# Patient Record
Sex: Female | Born: 1955
Health system: Southern US, Community
[De-identification: ages and names within clinical notes are randomized; demographics above are authoritative.]

## PROBLEM LIST (undated history)

## (undated) DIAGNOSIS — C801 Malignant (primary) neoplasm, unspecified: Secondary | ICD-10-CM

---

## 2007-10-13 ENCOUNTER — Ambulatory Visit: Payer: Self-pay | Admitting: Unknown Physician Specialty

## 2008-01-06 ENCOUNTER — Ambulatory Visit: Payer: Self-pay | Admitting: Gastroenterology

## 2008-01-06 LAB — HM COLONOSCOPY

## 2010-10-02 ENCOUNTER — Ambulatory Visit: Payer: Self-pay | Admitting: Family Medicine

## 2011-10-15 ENCOUNTER — Ambulatory Visit: Payer: Self-pay | Admitting: Unknown Physician Specialty

## 2012-12-01 ENCOUNTER — Ambulatory Visit: Payer: Self-pay | Admitting: Obstetrics and Gynecology

## 2013-12-23 ENCOUNTER — Ambulatory Visit: Payer: Self-pay | Admitting: Family Medicine

## 2014-11-06 DIAGNOSIS — C439 Malignant melanoma of skin, unspecified: Secondary | ICD-10-CM

## 2014-11-06 DIAGNOSIS — C4491 Basal cell carcinoma of skin, unspecified: Secondary | ICD-10-CM

## 2014-11-06 HISTORY — DX: Malignant melanoma of skin, unspecified: C43.9

## 2014-11-06 HISTORY — DX: Basal cell carcinoma of skin, unspecified: C44.91

## 2015-02-20 ENCOUNTER — Other Ambulatory Visit: Payer: Self-pay | Admitting: Family Medicine

## 2015-02-20 DIAGNOSIS — Z1231 Encounter for screening mammogram for malignant neoplasm of breast: Secondary | ICD-10-CM

## 2015-02-27 ENCOUNTER — Ambulatory Visit
Admission: RE | Admit: 2015-02-27 | Discharge: 2015-02-27 | Disposition: A | Discharge status: Home / Self Care | Payer: BLUE CROSS/BLUE SHIELD | Source: Ambulatory Visit | Attending: Family Medicine | Admitting: Family Medicine

## 2015-02-27 DIAGNOSIS — Z1231 Encounter for screening mammogram for malignant neoplasm of breast: Secondary | ICD-10-CM | POA: Insufficient documentation

## 2015-02-27 HISTORY — DX: Malignant (primary) neoplasm, unspecified: C80.1

## 2015-02-27 LAB — HM MAMMOGRAPHY

## 2015-07-31 ENCOUNTER — Ambulatory Visit (INDEPENDENT_AMBULATORY_CARE_PROVIDER_SITE_OTHER): Payer: BLUE CROSS/BLUE SHIELD | Admitting: Family Medicine

## 2015-07-31 ENCOUNTER — Encounter: Payer: Self-pay | Admitting: Family Medicine

## 2015-07-31 VITALS — BP 123/80 | HR 67 | Temp 98.7°F | Ht 64.0 in | Wt 169.0 lb

## 2015-07-31 DIAGNOSIS — E785 Hyperlipidemia, unspecified: Secondary | ICD-10-CM | POA: Insufficient documentation

## 2015-07-31 DIAGNOSIS — I10 Essential (primary) hypertension: Secondary | ICD-10-CM

## 2015-07-31 DIAGNOSIS — Z23 Encounter for immunization: Secondary | ICD-10-CM

## 2015-07-31 LAB — LP+ALT+AST PICCOLO, WAIVED
ALT (SGPT) Piccolo, Waived: 24 U/L (ref 10–47)
AST (SGOT) PICCOLO, WAIVED: 33 U/L (ref 11–38)
CHOL/HDL RATIO PICCOLO,WAIVE: 4.2 mg/dL
CHOLESTEROL PICCOLO, WAIVED: 218 mg/dL — AB (ref <200)
HDL Chol Piccolo, Waived: 52 mg/dL — ABNORMAL LOW (ref >59)
LDL Chol Calc Piccolo Waived: 118 mg/dL — ABNORMAL HIGH (ref <100)
Triglycerides Piccolo,Waived: 237 mg/dL — ABNORMAL HIGH (ref <150)
VLDL Chol Calc Piccolo,Waive: 47 mg/dL — ABNORMAL HIGH (ref <30)

## 2015-07-31 MED ORDER — LOVASTATIN 20 MG PO TABS
20.0000 mg | ORAL_TABLET | Freq: Every day | ORAL | Status: DC
Start: 2015-07-31 — End: 2016-01-31

## 2015-07-31 MED ORDER — LOSARTAN POTASSIUM 50 MG PO TABS: 50.0000 mg | ORAL_TABLET | Freq: Every day | ORAL | Status: DC

## 2015-07-31 NOTE — Assessment & Plan Note (Signed)
The current medical regimen is effective;  continue present plan and medications.  

## 2015-07-31 NOTE — Progress Notes (Signed)
BP 123/80 mmHg  Pulse 67  Temp(Src) 98.7 F (37.1 C)  Ht 5\' 4"  (1.626 m)  Wt 169 lb (76.658 kg)  BMI 28.99 kg/m2  SpO2 99%   Subjective:    Patient ID: Brenda Wagner, female    DOB: 06-08-1956, 59 y.o.   MRN: [id number]  HPI: Brenda Wagner is a 59 y.o. female  Chief Complaint  Patient presents with  . Hyperlipidemia  . Hypertension   patient follow-up cholesterol doing well has been walking and eating a little better diet in spite of 2 pound weight gain. No complaints from lovastatin 20 mg The pressure doing well on low-dose losartan no side effects takes medicines faithfully Ever since menopause of Imitrex as needed very little but doing well with migraines.  Relevant past medical, surgical, family and social history reviewed and updated as indicated. Interim medical history since our last visit reviewed. Allergies and medications reviewed and updated.  Review of Systems  Constitutional: Negative.   Respiratory: Negative.   Cardiovascular: Negative.     Per HPI unless specifically indicated above     Objective:    BP 123/80 mmHg  Pulse 67  Temp(Src) 98.7 F (37.1 C)  Ht 5\' 4"  (1.626 m)  Wt 169 lb (76.658 kg)  BMI 28.99 kg/m2  SpO2 99%  Wt Readings from Last 3 Encounters:  07/31/15 169 lb (76.658 kg)  07/30/15 167 lb (75.751 kg)    Physical Exam  Constitutional: She is oriented to person, place, and time. She appears well-developed and well-nourished. No distress.  HENT:  Head: Normocephalic and atraumatic.  Right Ear: Hearing normal.  Left Ear: Hearing normal.  Nose: Nose normal.  Eyes: Conjunctivae and lids are normal. Right eye exhibits no discharge. Left eye exhibits no discharge. No scleral icterus.  Cardiovascular: Normal rate, regular rhythm and normal heart sounds.   Pulmonary/Chest: Effort normal and breath sounds normal. No respiratory distress.  Musculoskeletal: Normal range of motion.  Neurological: She is alert and oriented to person,  place, and time.  Skin: Skin is intact. No rash noted.  Psychiatric: She has a normal mood and affect. Her speech is normal and behavior is normal. Judgment and thought content normal. Cognition and memory are normal.    Results for orders placed or performed in visit on 07/30/15  [facility] MAMMOGRAPHY  Result Value Ref Range   [facility] Mammogram PRACTICE PARTNER   [facility] COLONOSCOPY  Result Value Ref Range   [facility] Colonoscopy PRACTICE PARTNER       Assessment & Plan:   Problem List Items Addressed This Visit      Cardiovascular and Mediastinum   Essential hypertension, benign    The current medical regimen is effective;  continue present plan and medications.       Relevant Medications   losartan (COZAAR) 50 MG tablet   lovastatin (MEVACOR) 20 MG tablet   Other Relevant Orders   LP+ALT+AST Piccolo, Waived   Basic metabolic panel     Other   Hyperlipemia - Primary    The current medical regimen is effective;  continue present plan and medications.       Relevant Medications   losartan (COZAAR) 50 MG tablet   lovastatin (MEVACOR) 20 MG tablet   Other Relevant Orders   LP+ALT+AST Piccol[place], Waived   Basic metabolic panel    Other Visit Diagnoses    Immunization due        Relevant Orders    Flu Vaccine QUAD 36+ mos PF IM (  Fluarix & Fluzone Quad PF) (Completed)    Varicella-zoster vaccine subcutaneous (Completed)        Follow up plan: Return in about 6 months (around 01/29/2016) for Physical Exam.

## 2015-08-01 ENCOUNTER — Encounter: Payer: Self-pay | Admitting: Family Medicine

## 2015-08-01 LAB — BASIC METABOLIC PANEL
BUN / CREAT RATIO: 23 (ref 11–26)
BUN: 15 mg/dL (ref 8–27)
CHLORIDE: 100 mmol/L (ref 97–106)
CO2: 24 mmol/L (ref 18–29)
CREATININE: 0.64 mg/dL (ref 0.57–1.00)
Calcium: 9.5 mg/dL (ref 8.7–10.3)
GFR calc Af Amer: 112 mL/min/{1.73_m2} (ref >59)
GFR calc non Af Amer: 97 mL/min/{1.73_m2} (ref >59)
GLUCOSE: 105 mg/dL — AB (ref 65–99)
Potassium: 4.4 mmol/L (ref 3.5–5.2)
Sodium: 141 mmol/L (ref 136–144)

## 2015-08-13 ENCOUNTER — Ambulatory Visit (INDEPENDENT_AMBULATORY_CARE_PROVIDER_SITE_OTHER): Payer: BLUE CROSS/BLUE SHIELD | Admitting: Family Medicine

## 2015-08-13 ENCOUNTER — Encounter: Payer: Self-pay | Admitting: Family Medicine

## 2015-08-13 VITALS — BP 146/82 | HR 78 | Temp 98.8°F | Ht 63.2 in | Wt 170.0 lb

## 2015-08-13 DIAGNOSIS — H109 Unspecified conjunctivitis: Secondary | ICD-10-CM

## 2015-08-13 MED ORDER — ERYTHROMYCIN 5 MG/GM OP OINT: 1.0000 "application " | TOPICAL_OINTMENT | Freq: Every day | OPHTHALMIC | Status: DC

## 2015-08-13 NOTE — Patient Instructions (Signed)
Erythromycin eye ointment  What is this medicine?  ERYTHROMYCIN (er ith roe MYE sin) is a macrolide antibiotic. It is used to treat bacterial eye infections. It also prevents a certain type of eye infection that can occur in some babies.  This medicine may be used for other purposes; ask your health care provider or pharmacist if you have questions.  What should I tell my health care provider before I take this medicine?  -if you have an unusual or allergic reaction to erythromycin, foods, dyes, or preservatives  -pregnant or trying to get pregnant  -breast-feeding  How should I use this medicine?  This medicine is only for use in the eye. Follow the directions on the prescription label. Wash hands before and after use. Tilt your head back slightly and pull your lower eyelid down with your index finger to form a pouch. Try not to touch the tip of the tube, to your eye, fingertips, or any other surface. Squeeze the end of the tube to apply a thin layer of the ointment to the inside of the lower eyelid. Close the eye gently to spread the ointment. Your vision may blur for a few minutes. Use your doses at regular intervals. Do not use your medicine more often than directed. Finish the full course prescribed by your doctor or health care professional even if you think your condition is better. Do not stop using except on the advice of your doctor or health care professional.  Talk to your pediatrician regarding the use of this medicine in children. Special care may be needed.  Overdosage: If you think you have taken too much of this medicine contact a poison control center or emergency room at once.  NOTE: This medicine is only for you. Do not share this medicine with others.  What if I miss a dose?  If you miss a dose, use it as soon as you can. If it is almost time for your next dose, use only that dose. Do not use double or extra doses.  What may interact with this medicine?  Interactions are not expected. Do not use  any other eye products without telling your doctor or health care professional.  This list may not describe all possible interactions. Give your health care provider a list of all the medicines, herbs, non-prescription drugs, or dietary supplements you use. Also tell them if you smoke, drink alcohol, or use illegal drugs. Some items may interact with your medicine.  What should I watch for while using this medicine?  Tell your doctor or health care professional if your symptoms do not improve in 2 to 3 days.  What side effects may I notice from receiving this medicine?  Side effects that you should report to your doctor or health care professional as soon as possible:  -allergic reactions like skin rash, itching or hives, swelling of the face, lips, or tongue  -burning, stinging, or itching of the eyes or eyelids  -changes in vision  -redness, swelling, or pain  This list may not describe all possible side effects. Call your doctor for medical advice about side effects. You may report side effects to FDA at 1-800-FDA-1088.  Where should I keep my medicine?  Keep out of the reach of children.  Store at room temperature between 15 and 30 degrees C (59 and 86 degrees F). Do not freeze. Throw away any unused ointment after the expiration date.  NOTE: This sheet is a summary. It may not cover all 

## 2015-08-13 NOTE — Progress Notes (Signed)
BP 146/82 mmHg  Pulse 78  Temp(Src) 98.8 F (37.1 C)  Ht 5' 3.2" (1.605 m)  Wt 170 lb (77.111 kg)  BMI 29.93 kg/m2  SpO2 97%   Subjective:    Patient ID: Brenda Wagner, female    DOB: [birth year], 59 y.o.   MRN: [id number]  HPI: Brenda Wagner is a 59 y.o. female  Chief Complaint  Patient presents with  . Eye Pain    painful and painful   EYE PAIN- was on the fluoroplex started on Sunday night and started with eye irritation on Wednesday. Stopped medicine on Friday Duration:  5 days Involved eye:  left Onset: sudden Severity: fine during the day, but morning and at night has a mild pain  Quality: dull Foreign body sensation:yes Visual impairment: no Eye redness: yes Discharge: no Crusting or matting of eyelids: no Swelling: no Photophobia: no Itching: no Tearing: yes Headache: no Floaters: no URI symptoms: yes Contact lens use: no Close contacts with similar problems: no Eye trauma: no Status: stable Treatments attempted: nothing  Relevant past medical, surgical, family and social history reviewed and updated as indicated. Interim medical history since our last visit reviewed. Allergies and medications reviewed and updated.  Review of Systems  Constitutional: Negative.   HENT: Negative.   Eyes: Positive for pain and redness. Negative for photophobia, discharge, itching and visual disturbance.  Respiratory: Negative.   Cardiovascular: Negative.   Psychiatric/Behavioral: Negative.     Per HPI unless specifically indicated above     Objective:    BP 146/82 mmHg  Pulse 78  Temp(Src) 98.8 F (37.1 C)  Ht 5' 3.2" (1.605 m)  Wt 170 lb (77.111 kg)  BMI 29.93 kg/m2  SpO2 97%  Wt Readings from Last 3 Encounters:  08/13/15 170 lb (77.111 kg)  07/31/15 169 lb (76.658 kg)  07/30/15 167 lb (75.751 kg)    Physical Exam  Constitutional: Brenda Wagner is oriented to person, place, and time. Brenda Wagner appears well-developed and well-nourished. No distress.  HENT:  Head:  Normocephalic and atraumatic.  Right Ear: Hearing normal.  Left Ear: Hearing normal.  Nose: Nose normal.  Eyes: EOM and lids are normal. Pupils are equal, round, and reactive to light. Right eye exhibits no chemosis, no discharge, no exudate and no hordeolum. No foreign body present in the right eye. Left eye exhibits no chemosis, no discharge, no exudate and no hordeolum. No foreign body present in the left eye. Right conjunctiva is not injected. Right conjunctiva has no hemorrhage. Left conjunctiva is injected. Left conjunctiva has no hemorrhage. No scleral icterus. Right eye exhibits normal extraocular motion and no nystagmus. Left eye exhibits normal extraocular motion and no nystagmus. Right pupil is round and reactive. Left pupil is round and reactive. Pupils are equal.    Neck: Normal range of motion. Neck supple. No JVD present. No tracheal deviation present. No thyromegaly present.  Pulmonary/Chest: Effort normal. No stridor. No respiratory distress.  Musculoskeletal: Normal range of motion.  Lymphadenopathy:    Brenda Wagner has no cervical adenopathy.  Neurological: Brenda Wagner is alert and oriented to person, place, and time.  Skin: Skin is intact. No rash noted.  Psychiatric: Brenda Wagner has a normal mood and affect. Her speech is normal and behavior is normal. Judgment and thought content normal. Cognition and memory are normal.  Nursing note and vitals reviewed.   Results for orders placed or performed in visit on 07/31/15  LP+ALT+AST Piccolo, Waived  Result Value Ref Range   ALT (SGPT) [place], Waived  24 10 - 47 U/L   AST (SGOT) [place]col[place], Waived 33 11 - 38 U/L   Cholesterol Piccolo, Waived 218 (H) <200 mg/dL   HDL Chol Piccolo, Waived 52 (L) >59 mg/dL   Triglycerides Piccolo,Waived 237 (H) <150 mg/dL   Chol/HDL Ratio Piccolo,Waive 4.2 mg/dL   LDL Chol Calc Piccolo Waived 118 (H) <100 mg/dL   VLDL Chol Calc Piccolo,Waive 47 (H) <30 mg/dL  Basic metabolic panel  Result Value Ref Range   Glucose 105  (H) 65 - 99 mg/dL   BUN 15 8 - 27 mg/dL   Creatinine, Ser 0.64 0.57 - 1.00 mg/dL   GFR calc non Af Amer 97 >59 mL/min/1.73   GFR calc Af Amer 112 >59 mL/min/1.73   BUN/Creatinine Ratio 23 11 - 26   Sodium 141 136 - 144 mmol/L   Potassium 4.4 3.5 - 5.2 mmol/L   Chloride 100 97 - 106 mmol/L   CO2 24 18 - 29 mmol/L   Calcium 9.5 8.7 - 10.3 mg/dL      Assessment & Plan:   Problem List Items Addressed This Visit    None    Visit Diagnoses    Conjunctivitis of left eye    -  Primary    Likely due to her fluoroplex. Will start erythromycin ointment for irritation and anti-inflammatory. If not better in 2-3 days, Brenda Wagner was instructed to call derm.        Follow up plan: Return if symptoms worsen or fail to improve.

## 2015-10-04 DIAGNOSIS — D239 Other benign neoplasm of skin, unspecified: Secondary | ICD-10-CM

## 2015-10-04 HISTORY — DX: Other benign neoplasm of skin, unspecified: D23.9

## 2015-12-20 ENCOUNTER — Telehealth: Payer: Self-pay | Admitting: Family Medicine

## 2015-12-20 MED ORDER — OSELTAMIVIR PHOSPHATE 75 MG PO CAPS: 75.0000 mg | ORAL_CAPSULE | Freq: Every day | ORAL | Status: DC

## 2015-12-20 NOTE — Telephone Encounter (Signed)
Husband tested positive for influenza A. Prophylactic tamiflu given today.

## 2016-01-31 ENCOUNTER — Ambulatory Visit (INDEPENDENT_AMBULATORY_CARE_PROVIDER_SITE_OTHER): Payer: BLUE CROSS/BLUE SHIELD | Admitting: Family Medicine

## 2016-01-31 ENCOUNTER — Encounter: Payer: Self-pay | Admitting: Family Medicine

## 2016-01-31 VITALS — BP 131/83 | HR 65 | Temp 97.9°F | Ht 64.2 in | Wt 168.0 lb

## 2016-01-31 DIAGNOSIS — E785 Hyperlipidemia, unspecified: Secondary | ICD-10-CM

## 2016-01-31 DIAGNOSIS — Z1239 Encounter for other screening for malignant neoplasm of breast: Secondary | ICD-10-CM | POA: Diagnosis not present

## 2016-01-31 DIAGNOSIS — Z0000 Encounter for general adult medical examination without abnormal findings: Secondary | ICD-10-CM | POA: Diagnosis not present

## 2016-01-31 DIAGNOSIS — I10 Essential (primary) hypertension: Secondary | ICD-10-CM

## 2016-01-31 LAB — MICROSCOPIC EXAMINATION

## 2016-01-31 LAB — URINALYSIS, ROUTINE W REFLEX MICROSCOPIC
BILIRUBIN UA: NEGATIVE
GLUCOSE, UA: NEGATIVE
KETONES UA: NEGATIVE
Leukocytes, UA: NEGATIVE
NITRITE UA: NEGATIVE
SPEC GRAV UA: 1.015 (ref 1.005–1.030)
UUROB: 0.2 mg/dL (ref 0.2–1.0)
pH, UA: 7 (ref 5.0–7.5)

## 2016-01-31 MED ORDER — LOSARTAN POTASSIUM 50 MG PO TABS: 50.0000 mg | ORAL_TABLET | Freq: Every day | ORAL | Status: DC

## 2016-01-31 MED ORDER — LOVASTATIN 20 MG PO TABS: 20.0000 mg | ORAL_TABLET | Freq: Every day | ORAL | Status: DC

## 2016-01-31 MED ORDER — FLUTICASONE PROPIONATE 50 MCG/ACT NA SUSP: 2.0000 | Freq: Every day | NASAL | Status: DC

## 2016-01-31 NOTE — Progress Notes (Signed)
BP 131/83 mmHg  Pulse 65  Temp(Src) 97.9 F (36.6 C)  Ht 5' 4.2" (1.631 m)  Wt 168 lb (76.204 kg)  BMI 28.65 kg/m2  SpO2 96%   Subjective:    Patient ID: Brenda Wagner, female    DOB: [birth year], 60 y.o.   MRN: [id number]  HPI: Brenda Wagner is a 60 y.o. female  Chief Complaint  Patient presents with  . Annual Exam   Patient follow-up blood pressure doing well no complaints Cholesterol doing well taking medications faithfully Flonase for allergies helps control allergies well Imitrex only used once last year  Some issues concerning mammogram screening has dense breasts we'll investigate which screening procedures are more appropriate.  Relevant past medical, surgical, family and social history reviewed and updated as indicated. Interim medical history since our last visit reviewed. Allergies and medications reviewed and updated.  Review of Systems  Constitutional: Negative.   HENT: Negative.   Eyes: Negative.   Respiratory: Negative.   Cardiovascular: Negative.   Gastrointestinal: Negative.   Endocrine: Negative.   Genitourinary: Negative.   Musculoskeletal: Negative.   Skin: Negative.   Allergic/Immunologic: Negative.   Neurological: Negative.   Hematological: Negative.   Psychiatric/Behavioral: Negative.     Per HPI unless specifically indicated above     Objective:    BP 131/83 mmHg  Pulse 65  Temp(Src) 97.9 F (36.6 C)  Ht 5' 4.2" (1.631 m)  Wt 168 lb (76.204 kg)  BMI 28.65 kg/m2  SpO2 96%  Wt Readings from Last 3 Encounters:  01/31/16 168 lb (76.204 kg)  08/13/15 170 lb (77.111 kg)  07/31/15 169 lb (76.658 kg)    Physical Exam  Constitutional: She is oriented to person, place, and time. She appears well-developed and well-nourished.  HENT:  Head: Normocephalic and atraumatic.  Right Ear: External ear normal.  Left Ear: External ear normal.  Nose: Nose normal.  Mouth/Throat: Oropharynx is clear and moist.  Eyes: Conjunctivae and EOM  are normal. Pupils are equal, round, and reactive to light.  Neck: Normal range of motion. Neck supple. Carotid bruit is not present.  Cardiovascular: Normal rate, regular rhythm and normal heart sounds.   No murmur heard. Pulmonary/Chest: Effort normal and breath sounds normal. She exhibits no mass. Right breast exhibits no mass, no skin change and no tenderness. Left breast exhibits no mass, no skin change and no tenderness. Breasts are symmetrical.  Abdominal: Soft. Bowel sounds are normal. There is no hepatosplenomegaly.  Musculoskeletal: Normal range of motion.  Neurological: She is alert and oriented to person, place, and time.  Skin: No rash noted.  Psychiatric: She has a normal mood and affect. Her behavior is normal. Judgment and thought content normal.    Results for orders placed or performed in visit on 07/31/15  LP+ALT+AST [place], [employer]  Result Value Ref Range   ALT (SGPT) [place], Waived 24 10 - 47 U/L   AST (SGOT) [place], Waived 33 11 - 38 U/L   Cholesterol Piccol[place], Waived 218 (H) <200 mg/dL   HDL Chol [place], Waived 52 (L) >59 mg/dL   Triglycerides Piccolo,Waived 237 (H) <150 mg/dL   Chol/HDL Ratio Piccol[place],Waive 4.2 mg/dL   LDL Chol Calc Piccolo Waived 118 (H) <100 mg/dL   VLDL Chol Calc Piccolo,Waive 47 (H) <30 mg/dL  Basic metabolic panel  Result Value Ref Range   Glucose 105 (H) 65 - 99 mg/dL   BUN 15 8 - 27 mg/dL   Creatinine, Ser 0.64 0.57 - 1.00 mg/dL  GFR calc non Af Amer 97 >59 mL/min/1.73   GFR calc Af Amer 112 >59 mL/min/1.73   BUN/Creatinine Ratio 23 11 - 26   Sodium 141 136 - 144 mmol/L   Potassium 4.4 3.5 - 5.2 mmol/L   Chloride 100 97 - 106 mmol/L   CO2 24 18 - 29 mmol/L   Calcium 9.5 8.7 - 10.3 mg/dL      Assessment & Plan:   Problem List Items Addressed This Visit      Cardiovascular and Mediastinum   Essential hypertension, benign    The current medical regimen is effective;  continue present plan and medications.       Relevant  Medications   losartan (COZAAR) 50 MG tablet   lovastatin (MEVACOR) 20 MG tablet     Other   Hyperlipemia    The current medical regimen is effective;  continue present plan and medications.       Relevant Medications   losartan (COZAAR) 50 MG tablet   lovastatin (MEVACOR) 20 MG tablet    Other Visit Diagnoses    Routine general medical examination at a health care facility    -  Primary    Relevant Orders    CBC with Differential/Platelet    Comprehensive metabolic panel    Lipid Panel w/o Chol/HDL Ratio    TSH    Urinalysis, Routine w reflex microscopic (not at [facility])    Health care maintenance        Relevant Orders    Hepatitis C Antibody    PE (physical exam), annual            Follow up plan: Return in about 6 months (around 08/01/2016) for BMP, lipids, alt, ast.

## 2016-01-31 NOTE — Assessment & Plan Note (Signed)
The current medical regimen is effective;  continue present plan and medications.  

## 2016-01-31 NOTE — Addendum Note (Signed)
Addended by: Wynn Maudlin on: 01/31/2016 08:47 AM   Modules accepted: Orders, SmartSet

## 2016-02-01 LAB — CBC WITH DIFFERENTIAL/PLATELET
BASOS: 0 %
Basophils Absolute: 0 10*3/uL (ref 0.0–0.2)
EOS (ABSOLUTE): 0.1 10*3/uL (ref 0.0–0.4)
EOS: 3 %
HEMATOCRIT: 41.5 % (ref 34.0–46.6)
Hemoglobin: 13.7 g/dL (ref 11.1–15.9)
IMMATURE GRANULOCYTES: 0 %
Immature Grans (Abs): 0 10*3/uL (ref 0.0–0.1)
Lymphocytes Absolute: 1.3 10*3/uL (ref 0.7–3.1)
Lymphs: 28 %
MCH: 29.5 pg (ref 26.6–33.0)
MCHC: 33 g/dL (ref 31.5–35.7)
MCV: 89 fL (ref 79–97)
MONOS ABS: 0.4 10*3/uL (ref 0.1–0.9)
Monocytes: 8 %
NEUTROS ABS: 2.8 10*3/uL (ref 1.4–7.0)
Neutrophils: 61 %
Platelets: 282 10*3/uL (ref 150–379)
RBC: 4.64 x10E6/uL (ref 3.77–5.28)
RDW: 13.3 % (ref 12.3–15.4)
WBC: 4.6 10*3/uL (ref 3.4–10.8)

## 2016-02-01 LAB — COMPREHENSIVE METABOLIC PANEL
A/G RATIO: 1.8 (ref 1.2–2.2)
ALT: 21 IU/L (ref 0–32)
AST: 21 IU/L (ref 0–40)
Albumin: 4.4 g/dL (ref 3.6–4.8)
Alkaline Phosphatase: 76 IU/L (ref 39–117)
BUN/Creatinine Ratio: 21 (ref 12–28)
BUN: 14 mg/dL (ref 8–27)
Bilirubin Total: 0.5 mg/dL (ref 0.0–1.2)
CALCIUM: 9.4 mg/dL (ref 8.7–10.3)
CO2: 24 mmol/L (ref 18–29)
Chloride: 100 mmol/L (ref 96–106)
Creatinine, Ser: 0.68 mg/dL (ref 0.57–1.00)
GFR, EST AFRICAN AMERICAN: 109 mL/min/{1.73_m2} (ref >59)
GFR, EST NON AFRICAN AMERICAN: 95 mL/min/{1.73_m2} (ref >59)
GLOBULIN, TOTAL: 2.4 g/dL (ref 1.5–4.5)
Glucose: 103 mg/dL — ABNORMAL HIGH (ref 65–99)
POTASSIUM: 4.8 mmol/L (ref 3.5–5.2)
SODIUM: 140 mmol/L (ref 134–144)
TOTAL PROTEIN: 6.8 g/dL (ref 6.0–8.5)

## 2016-02-01 LAB — LIPID PANEL W/O CHOL/HDL RATIO
Cholesterol, Total: 197 mg/dL (ref 100–199)
HDL: 52 mg/dL (ref >39)
LDL Calculated: 114 mg/dL — ABNORMAL HIGH (ref 0–99)
Triglycerides: 153 mg/dL — ABNORMAL HIGH (ref 0–149)
VLDL Cholesterol Cal: 31 mg/dL (ref 5–40)

## 2016-02-01 LAB — TSH: TSH: 2.12 u[IU]/mL (ref 0.450–4.500)

## 2016-02-01 LAB — HEPATITIS C ANTIBODY

## 2016-02-04 ENCOUNTER — Encounter: Payer: Self-pay | Admitting: Family Medicine

## 2016-04-03 ENCOUNTER — Ambulatory Visit: Payer: BLUE CROSS/BLUE SHIELD

## 2016-04-10 ENCOUNTER — Other Ambulatory Visit: Payer: Self-pay | Admitting: Family Medicine

## 2016-04-10 ENCOUNTER — Ambulatory Visit
Admission: RE | Admit: 2016-04-10 | Discharge: 2016-04-10 | Disposition: A | Discharge status: Home / Self Care | Payer: BLUE CROSS/BLUE SHIELD | Source: Ambulatory Visit | Attending: Family Medicine | Admitting: Family Medicine

## 2016-04-10 DIAGNOSIS — Z1239 Encounter for other screening for malignant neoplasm of breast: Secondary | ICD-10-CM

## 2016-04-10 DIAGNOSIS — Z1231 Encounter for screening mammogram for malignant neoplasm of breast: Secondary | ICD-10-CM | POA: Diagnosis not present

## 2016-08-04 ENCOUNTER — Encounter: Payer: Self-pay | Admitting: Family Medicine

## 2016-08-04 ENCOUNTER — Ambulatory Visit (INDEPENDENT_AMBULATORY_CARE_PROVIDER_SITE_OTHER): Payer: BLUE CROSS/BLUE SHIELD | Admitting: Family Medicine

## 2016-08-04 VITALS — BP 132/78 | HR 71 | Temp 98.1°F | Wt 168.0 lb

## 2016-08-04 DIAGNOSIS — E7849 Other hyperlipidemia: Secondary | ICD-10-CM

## 2016-08-04 DIAGNOSIS — I10 Essential (primary) hypertension: Secondary | ICD-10-CM | POA: Diagnosis not present

## 2016-08-04 DIAGNOSIS — E784 Other hyperlipidemia: Secondary | ICD-10-CM

## 2016-08-04 DIAGNOSIS — Z23 Encounter for immunization: Secondary | ICD-10-CM

## 2016-08-04 LAB — LP+ALT+AST PICCOLO, WAIVED
ALT (SGPT) Piccolo, Waived: 20 U/L (ref 10–47)
AST (SGOT) Piccolo, Waived: 29 U/L (ref 11–38)
CHOLESTEROL PICCOLO, WAIVED: 200 mg/dL — AB (ref <200)
Chol/HDL Ratio Piccolo,Waive: 3.7 mg/dL
HDL Chol Piccolo, Waived: 54 mg/dL — ABNORMAL LOW (ref >59)
LDL Chol Calc Piccolo Waived: 107 mg/dL — ABNORMAL HIGH (ref <100)
TRIGLYCERIDES PICCOLO,WAIVED: 197 mg/dL — AB (ref <150)
VLDL Chol Calc Piccolo,Waive: 39 mg/dL — ABNORMAL HIGH (ref <30)

## 2016-08-04 NOTE — Progress Notes (Signed)
BP 132/78 (BP Location: Left Arm)   Pulse 71   Temp 98.1 F (36.7 C)   Wt 168 lb (76.2 kg)   SpO2 98%   BMI 28.66 kg/m    Subjective:    Patient ID: Brenda Wagner, female    DOB: [birth year], 60 y.o.   MRN: [id number]  HPI: Brenda Wagner is a 60 y.o. female  Chief Complaint  Patient presents with  . Hyperlipidemia  . Hypertension  Patient follow-up doing well no complaints from medications takes faithfully without problems good blood pressure and cluster all. Patient has lost a couple of pounds  Relevant past medical, surgical, family and social history reviewed and updated as indicated. Interim medical history since our last visit reviewed. Allergies and medications reviewed and updated.  Review of Systems  Constitutional: Negative.   Respiratory: Negative.   Cardiovascular: Negative.     Per HPI unless specifically indicated above     Objective:    BP 132/78 (BP Location: Left Arm)   Pulse 71   Temp 98.1 F (36.7 C)   Wt 168 lb (76.2 kg)   SpO2 98%   BMI 28.66 kg/m   Wt Readings from Last 3 Encounters:  08/04/16 168 lb (76.2 kg)  01/31/16 168 lb (76.2 kg)  08/13/15 170 lb (77.1 kg)    Physical Exam  Constitutional: She is oriented to person, place, and time. She appears well-developed and well-nourished. No distress.  HENT:  Head: Normocephalic and atraumatic.  Right Ear: Hearing normal.  Left Ear: Hearing normal.  Nose: Nose normal.  Eyes: Conjunctivae and lids are normal. Right eye exhibits no discharge. Left eye exhibits no discharge. No scleral icterus.  Cardiovascular: Normal rate, regular rhythm and normal heart sounds.   Pulmonary/Chest: Effort normal and breath sounds normal. No respiratory distress.  Musculoskeletal: Normal range of motion.  Neurological: She is alert and oriented to person, place, and time.  Skin: Skin is intact. No rash noted.  Psychiatric: She has a normal mood and affect. Her speech is normal and behavior is normal.  Judgment and thought content normal. Cognition and memory are normal.    Results for orders placed or performed in visit on 01/31/16  Microscopic Examination  Result Value Ref Range   WBC, UA 0-5 0 - 5 /hpf   RBC, UA 0-2 0 - 2 /hpf   Epithelial Cells (non renal) 0-10 0 - 10 /hpf   Bacteria, UA Few None seen/Few  CBC with Differential/Platelet  Result Value Ref Range   WBC 4.6 3.4 - 10.8 x10E3/uL   RBC 4.64 3.77 - 5.28 x10E6/uL   Hemoglobin 13.7 11.1 - 15.9 g/dL   Hematocrit 41.5 34.0 - 46.6 %   MCV 89 79 - 97 fL   MCH 29.5 26.6 - 33.0 pg   MCHC 33.0 31.5 - 35.7 g/dL   RDW 13.3 12.3 - 15.4 %   Platelets 282 150 - 379 x10E3/uL   Neutrophils 61 %   Lymphs 28 %   Monocytes 8 %   Eos 3 %   Basos 0 %   Neutrophils Absolute 2.8 1.4 - 7.0 x10E3/uL   Lymphocytes Absolute 1.3 0.7 - 3.1 x10E3/uL   Monocytes Absolute 0.4 0.1 - 0.9 x10E3/uL   EOS (ABSOLUTE) 0.1 0.0 - 0.4 x10E3/uL   Basophils Absolute 0.0 0.0 - 0.2 x10E3/uL   Immature Granulocytes 0 %   Immature Grans (Abs) 0.0 0.0 - 0.1 x10E3/uL  Comprehensive metabolic panel  Result Value Ref Range  Glucose 103 (H) 65 - 99 mg/dL   BUN 14 8 - 27 mg/dL   Creatinine, Ser 0.68 0.57 - 1.00 mg/dL   GFR calc non Af Amer 95 >59 mL/min/1.73   GFR calc Af Amer 109 >59 mL/min/1.73   BUN/Creatinine Ratio 21 12 - 28   Sodium 140 134 - 144 mmol/L   Potassium 4.8 3.5 - 5.2 mmol/L   Chloride 100 96 - 106 mmol/L   CO2 24 18 - 29 mmol/L   Calcium 9.4 8.7 - 10.3 mg/dL   Total Protein 6.8 6.0 - 8.5 g/dL   Albumin 4.4 3.6 - 4.8 g/dL   Globulin, Total 2.4 1.5 - 4.5 g/dL   Albumin/Globulin Ratio 1.8 1.2 - 2.2   Bilirubin Total 0.5 0.0 - 1.2 mg/dL   Alkaline Phosphatase 76 39 - 117 IU/L   AST 21 0 - 40 IU/L   ALT 21 0 - 32 IU/L  Lipid Panel w/o Chol/HDL Ratio  Result Value Ref Range   Cholesterol, Total 197 100 - 199 mg/dL   Triglycerides 153 (H) 0 - 149 mg/dL   HDL 52 >39 mg/dL   VLDL Cholesterol Cal 31 5 - 40 mg/dL   LDL Calculated 114 (H)  0 - 99 mg/dL  TSH  Result Value Ref Range   TSH 2.120 0.450 - 4.500 uIU/mL  Urinalysis, Routine w reflex microscopic (not at [facility])  Result Value Ref Range   Specific Gravity, UA 1.015 1.005 - 1.030   pH, UA 7.0 5.0 - 7.5   Color, UA Yellow Yellow   Appearance Ur Clear Clear   Leukocytes, UA Negative Negative   Protein, UA Trace Negative/Trace   Glucose, UA Negative Negative   Ketones, UA Negative Negative   RBC, UA Trace (A) Negative   Bilirubin, UA Negative Negative   Urobilinogen, Ur 0.2 0.2 - 1.0 mg/dL   Nitrite, UA Negative Negative   Microscopic Examination See below:   Hepatitis C Antibody  Result Value Ref Range   Hep C Virus Ab <0.1 0.0 - 0.9 s/co ratio      Assessment & Plan:   Problem List Items Addressed This Visit      Cardiovascular and Mediastinum   Essential hypertension, benign - Primary    The current medical regimen is effective;  continue present plan and medications.       Relevant Orders   Basic metabolic panel     Other   Hyperlipemia    The current medical regimen is effective;  continue present plan and medications.       Relevant Orders   LP+ALT+AST [place], Waived (STAT)    Other Visit Diagnoses    Needs flu shot       Encounter for immunization       Relevant Orders   Flu Vaccine QUAD 36+ mos IM (Completed)       Follow up plan: Return in about 6 months (around 02/02/2017) for Physical Exam.

## 2016-08-04 NOTE — Assessment & Plan Note (Signed)
The current medical regimen is effective;  continue present plan and medications.  

## 2016-08-05 ENCOUNTER — Encounter: Payer: Self-pay | Admitting: Family Medicine

## 2016-08-05 LAB — BASIC METABOLIC PANEL
BUN/Creatinine Ratio: 19 (ref 12–28)
BUN: 13 mg/dL (ref 8–27)
CALCIUM: 9.6 mg/dL (ref 8.7–10.3)
CO2: 25 mmol/L (ref 18–29)
Chloride: 96 mmol/L (ref 96–106)
Creatinine, Ser: 0.69 mg/dL (ref 0.57–1.00)
GFR calc Af Amer: 109 mL/min/{1.73_m2} (ref >59)
GFR, EST NON AFRICAN AMERICAN: 94 mL/min/{1.73_m2} (ref >59)
GLUCOSE: 104 mg/dL — AB (ref 65–99)
POTASSIUM: 4.3 mmol/L (ref 3.5–5.2)
SODIUM: 138 mmol/L (ref 134–144)

## 2016-08-08 ENCOUNTER — Telehealth: Payer: Self-pay | Admitting: Family Medicine

## 2016-08-08 NOTE — Telephone Encounter (Signed)
Spoke with pharmacy, patient has refills on all the medications

## 2016-08-08 NOTE — Telephone Encounter (Signed)
Pt called stated pharmacy never received refills on the following:  Flonase Losartan Lovastatin    Pharm is [facility] in [place]. Thanks.

## 2017-02-03 ENCOUNTER — Encounter: Payer: Self-pay | Admitting: Family Medicine

## 2017-02-03 ENCOUNTER — Ambulatory Visit (INDEPENDENT_AMBULATORY_CARE_PROVIDER_SITE_OTHER): Payer: BLUE CROSS/BLUE SHIELD | Admitting: Family Medicine

## 2017-02-03 VITALS — BP 157/83 | HR 99 | Ht 64.76 in | Wt 172.0 lb

## 2017-02-03 DIAGNOSIS — E7849 Other hyperlipidemia: Secondary | ICD-10-CM

## 2017-02-03 DIAGNOSIS — Z0000 Encounter for general adult medical examination without abnormal findings: Secondary | ICD-10-CM

## 2017-02-03 DIAGNOSIS — Z13220 Encounter for screening for lipoid disorders: Secondary | ICD-10-CM

## 2017-02-03 DIAGNOSIS — E7800 Pure hypercholesterolemia, unspecified: Secondary | ICD-10-CM | POA: Diagnosis not present

## 2017-02-03 DIAGNOSIS — E784 Other hyperlipidemia: Secondary | ICD-10-CM | POA: Diagnosis not present

## 2017-02-03 DIAGNOSIS — Z1329 Encounter for screening for other suspected endocrine disorder: Secondary | ICD-10-CM

## 2017-02-03 DIAGNOSIS — I10 Essential (primary) hypertension: Secondary | ICD-10-CM | POA: Diagnosis not present

## 2017-02-03 LAB — URINALYSIS, ROUTINE W REFLEX MICROSCOPIC
BILIRUBIN UA: NEGATIVE
Glucose, UA: NEGATIVE
Ketones, UA: NEGATIVE
LEUKOCYTES UA: NEGATIVE
Nitrite, UA: NEGATIVE
PH UA: 6.5 (ref 5.0–7.5)
Protein, UA: NEGATIVE
Specific Gravity, UA: 1.02 (ref 1.005–1.030)
Urobilinogen, Ur: 0.2 mg/dL (ref 0.2–1.0)

## 2017-02-03 LAB — MICROSCOPIC EXAMINATION
BACTERIA UA: NONE SEEN
RBC, UA: NONE SEEN /hpf (ref 0–?)

## 2017-02-03 MED ORDER — FLUTICASONE PROPIONATE 50 MCG/ACT NA SUSP: 2.0000 | Freq: Every day | NASAL | 4 refills | Status: DC

## 2017-02-03 MED ORDER — LOSARTAN POTASSIUM 100 MG PO TABS: 100.0000 mg | ORAL_TABLET | Freq: Every day | ORAL | 4 refills | Status: DC

## 2017-02-03 MED ORDER — LOVASTATIN 20 MG PO TABS: 20.0000 mg | ORAL_TABLET | Freq: Every day | ORAL | 4 refills | Status: DC

## 2017-02-03 NOTE — Assessment & Plan Note (Signed)
Discussed blood pressure elevation and on chart review has had some intermittent elevations also will increase losartan from 50 mg to 100 mg and recheck 1 month with BMP.

## 2017-02-03 NOTE — Assessment & Plan Note (Signed)
The current medical regimen is effective;  continue present plan and medications.  

## 2017-02-03 NOTE — Progress Notes (Signed)
BP (!) 157/83   Pulse 99   Ht 5' 4.76" (1.645 m)   Wt 172 lb (78 kg)   SpO2 99%   BMI 28.83 kg/m    Subjective:    Patient ID: Brenda Wagner, female    DOB: 05/16/1956, 61 y.o.   MRN: [id number]  HPI: Brenda Wagner is a 61 y.o. female  Annual exam  Patient all in all doing well been very bizzy lifestyle but with good stuff vacation and vacationing homes. Takes losartan without problems and lovastatin without problems for cholesterol. Hasn't needed either Treximet over 2 years. Hasn't had any migraines Allergies been using Flonase without problems seems to help.  Relevant past medical, surgical, family and social history reviewed and updated as indicated. Interim medical history since our last visit reviewed. Allergies and medications reviewed and updated.  Review of Systems  Constitutional: Negative.   HENT: Negative.   Eyes: Negative.   Respiratory: Negative.   Cardiovascular: Negative.   Gastrointestinal: Negative.   Endocrine: Negative.   Genitourinary: Negative.   Musculoskeletal: Negative.   Skin: Negative.   Allergic/Immunologic: Negative.   Neurological: Negative.   Hematological: Negative.   Psychiatric/Behavioral: Negative.     Per HPI unless specifically indicated above     Objective:    BP (!) 157/83   Pulse 99   Ht 5' 4.76" (1.645 m)   Wt 172 lb (78 kg)   SpO2 99%   BMI 28.83 kg/m   Wt Readings from Last 3 Encounters:  02/03/17 172 lb (78 kg)  08/04/16 168 lb (76.2 kg)  01/31/16 168 lb (76.2 kg)    Physical Exam  Constitutional: She is oriented to person, place, and time. She appears well-developed and well-nourished.  HENT:  Head: Normocephalic and atraumatic.  Right Ear: External ear normal.  Left Ear: External ear normal.  Nose: Nose normal.  Mouth/Throat: Oropharynx is clear and moist.  Eyes: Conjunctivae and EOM are normal. Pupils are equal, round, and reactive to light.  Neck: Normal range of motion. Neck supple. Carotid bruit  is not present.  Cardiovascular: Normal rate, regular rhythm and normal heart sounds.   No murmur heard. Pulmonary/Chest: Effort normal and breath sounds normal.  Breast exam done at mamography  Abdominal: Soft. Bowel sounds are normal. There is no hepatosplenomegaly.  Musculoskeletal: Normal range of motion.  Neurological: She is alert and oriented to person, place, and time.  Skin: No rash noted.  Psychiatric: She has a normal mood and affect. Her behavior is normal. Judgment and thought content normal.    Results for orders placed or performed in visit on 08/04/16  LP+ALT+AST Piccolo, Waived (STAT)  Result Value Ref Range   ALT (SGPT) Piccolo, Waived 20 10 - 47 U/L   AST (SGOT) Piccolo, Waived 29 11 - 38 U/L   Cholesterol Piccolo, Waived 200 (H) <200 mg/dL   HDL Chol Piccolo, Waived 54 (L) >59 mg/dL   Triglycerides Piccolo,Waived 197 (H) <150 mg/dL   Chol/HDL Ratio Piccolo,Waive 3.7 mg/dL   LDL Chol Calc Piccolo Waived 107 (H) <100 mg/dL   VLDL Chol Calc Piccolo,Waive 39 (H) <30 mg/dL  Basic metabolic panel  Result Value Ref Range   Glucose 104 (H) 65 - 99 mg/dL   BUN 13 8 - 27 mg/dL   Creatinine, Ser 0.69 0.57 - 1.00 mg/dL   GFR calc non Af Amer 94 >59 mL/min/1.73   GFR calc Af Amer 109 >59 mL/min/1.73   BUN/Creatinine Ratio 19 12 - 28  Sodium 138 134 - 144 mmol/L   Potassium 4.3 3.5 - 5.2 mmol/L   Chloride 96 96 - 106 mmol/L   CO2 25 18 - 29 mmol/L   Calcium 9.6 8.7 - 10.3 mg/dL      Assessment & Plan:   Problem List Items Addressed This Visit      Cardiovascular and Mediastinum   Essential hypertension, benign    Discussed blood pressure elevation and on chart review has had some intermittent elevations also will increase losartan from 50 mg to 100 mg and recheck 1 month with BMP.      Relevant Medications   losartan (COZAAR) 100 MG tablet   lovastatin (MEVACOR) 20 MG tablet   Other Relevant Orders   CBC with Differential/Platelet   Comprehensive metabolic  panel   Urinalysis, Routine w reflex microscopic     Other   Hyperlipemia    The current medical regimen is effective;  continue present plan and medications.       Relevant Medications   losartan (COZAAR) 100 MG tablet   lovastatin (MEVACOR) 20 MG tablet   Other Relevant Orders   CBC with Differential/Platelet   Comprehensive metabolic panel   Urinalysis, Routine w reflex microscopic    Other Visit Diagnoses    Annual physical exam    -  Primary   Relevant Orders   CBC with Differential/Platelet   Comprehensive metabolic panel   Lipid panel   TSH   Urinalysis, Routine w reflex microscopic   Screening cholesterol level       Relevant Orders   Lipid panel   Thyroid disorder screen       Relevant Orders   TSH       Follow up plan: Return in about 4 weeks (around 03/03/2017) for BMP.

## 2017-02-04 ENCOUNTER — Other Ambulatory Visit: Payer: Self-pay | Admitting: Family Medicine

## 2017-02-04 ENCOUNTER — Encounter: Payer: Self-pay | Admitting: Family Medicine

## 2017-02-04 DIAGNOSIS — I10 Essential (primary) hypertension: Secondary | ICD-10-CM

## 2017-02-04 DIAGNOSIS — E785 Hyperlipidemia, unspecified: Secondary | ICD-10-CM

## 2017-02-04 LAB — COMPREHENSIVE METABOLIC PANEL
ALT: 18 IU/L (ref 0–32)
AST: 19 IU/L (ref 0–40)
Albumin/Globulin Ratio: 1.8 (ref 1.2–2.2)
Albumin: 4.3 g/dL (ref 3.6–4.8)
Alkaline Phosphatase: 76 IU/L (ref 39–117)
BUN/Creatinine Ratio: 20 (ref 12–28)
BUN: 14 mg/dL (ref 8–27)
Bilirubin Total: 0.4 mg/dL (ref 0.0–1.2)
CALCIUM: 9.6 mg/dL (ref 8.7–10.3)
CHLORIDE: 100 mmol/L (ref 96–106)
CO2: 25 mmol/L (ref 18–29)
CREATININE: 0.71 mg/dL (ref 0.57–1.00)
GFR, EST AFRICAN AMERICAN: 106 mL/min/{1.73_m2} (ref >59)
GFR, EST NON AFRICAN AMERICAN: 92 mL/min/{1.73_m2} (ref >59)
GLUCOSE: 114 mg/dL — AB (ref 65–99)
Globulin, Total: 2.4 g/dL (ref 1.5–4.5)
Potassium: 4.4 mmol/L (ref 3.5–5.2)
Sodium: 139 mmol/L (ref 134–144)
TOTAL PROTEIN: 6.7 g/dL (ref 6.0–8.5)

## 2017-02-04 LAB — CBC WITH DIFFERENTIAL/PLATELET
BASOS: 0 %
Basophils Absolute: 0 10*3/uL (ref 0.0–0.2)
EOS (ABSOLUTE): 0.1 10*3/uL (ref 0.0–0.4)
EOS: 2 %
HEMATOCRIT: 43.3 % (ref 34.0–46.6)
Hemoglobin: 14.1 g/dL (ref 11.1–15.9)
IMMATURE GRANULOCYTES: 0 %
Immature Grans (Abs): 0 10*3/uL (ref 0.0–0.1)
LYMPHS: 28 %
Lymphocytes Absolute: 1.3 10*3/uL (ref 0.7–3.1)
MCH: 29.9 pg (ref 26.6–33.0)
MCHC: 32.6 g/dL (ref 31.5–35.7)
MCV: 92 fL (ref 79–97)
Monocytes Absolute: 0.3 10*3/uL (ref 0.1–0.9)
Monocytes: 7 %
NEUTROS PCT: 63 %
Neutrophils Absolute: 2.9 10*3/uL (ref 1.4–7.0)
PLATELETS: 283 10*3/uL (ref 150–379)
RBC: 4.71 x10E6/uL (ref 3.77–5.28)
RDW: 13.6 % (ref 12.3–15.4)
WBC: 4.5 10*3/uL (ref 3.4–10.8)

## 2017-02-04 LAB — LIPID PANEL
CHOLESTEROL TOTAL: 217 mg/dL — AB (ref 100–199)
Chol/HDL Ratio: 4.1 ratio (ref 0.0–4.4)
HDL: 53 mg/dL (ref >39)
LDL CALC: 123 mg/dL — AB (ref 0–99)
Triglycerides: 205 mg/dL — ABNORMAL HIGH (ref 0–149)
VLDL Cholesterol Cal: 41 mg/dL — ABNORMAL HIGH (ref 5–40)

## 2017-02-04 LAB — TSH: TSH: 2.59 u[IU]/mL (ref 0.450–4.500)

## 2017-05-26 ENCOUNTER — Other Ambulatory Visit: Payer: Self-pay | Admitting: Family Medicine

## 2017-05-26 DIAGNOSIS — Z1231 Encounter for screening mammogram for malignant neoplasm of breast: Secondary | ICD-10-CM

## 2017-06-10 ENCOUNTER — Ambulatory Visit
Admission: RE | Admit: 2017-06-10 | Discharge: 2017-06-10 | Disposition: A | Discharge status: Home / Self Care | Payer: BLUE CROSS/BLUE SHIELD | Source: Ambulatory Visit | Attending: Family Medicine | Admitting: Family Medicine

## 2017-06-10 DIAGNOSIS — Z1231 Encounter for screening mammogram for malignant neoplasm of breast: Secondary | ICD-10-CM | POA: Diagnosis not present

## 2017-06-11 ENCOUNTER — Encounter: Payer: Self-pay | Admitting: Family Medicine

## 2017-08-05 ENCOUNTER — Ambulatory Visit (INDEPENDENT_AMBULATORY_CARE_PROVIDER_SITE_OTHER): Payer: BLUE CROSS/BLUE SHIELD | Admitting: Family Medicine

## 2017-08-05 ENCOUNTER — Encounter: Payer: Self-pay | Admitting: Family Medicine

## 2017-08-05 VITALS — BP 135/84 | HR 74 | Wt 172.0 lb

## 2017-08-05 DIAGNOSIS — I10 Essential (primary) hypertension: Secondary | ICD-10-CM | POA: Diagnosis not present

## 2017-08-05 DIAGNOSIS — Z23 Encounter for immunization: Secondary | ICD-10-CM

## 2017-08-05 DIAGNOSIS — E7849 Other hyperlipidemia: Secondary | ICD-10-CM | POA: Diagnosis not present

## 2017-08-05 NOTE — Assessment & Plan Note (Signed)
The current medical regimen is effective;  continue present plan and medications.  

## 2017-08-05 NOTE — Patient Instructions (Signed)

## 2017-08-05 NOTE — Progress Notes (Signed)
BP 135/84   Pulse 74   Wt 172 lb (78 kg)   SpO2 99%   BMI 28.83 kg/m    Subjective:    Patient ID: Brenda Wagner, female    DOB: Sep 26, 1956, 61 y.o.   MRN: [id number]  HPI: Brenda Wagner is a 61 y.o. female  Chief Complaint  Patient presents with  . Follow-up  . Hypertension   Follow-up hypertension doing well no complaints from increased dose to losartan 100 mg taken one a day without problems and good blood pressure control. Taking lovastatin also without problems good control. No further migraine headaches does have sumatriptan and background if needed. Has Flonase also for allergies which are doing okay.  Relevant past medical, surgical, family and social history reviewed and updated as indicated. Interim medical history since our last visit reviewed. Allergies and medications reviewed and updated.  Review of Systems  Constitutional: Negative.   Respiratory: Negative.   Cardiovascular: Negative.     Per HPI unless specifically indicated above     Objective:    BP 135/84   Pulse 74   Wt 172 lb (78 kg)   SpO2 99%   BMI 28.83 kg/m   Wt Readings from Last 3 Encounters:  08/05/17 172 lb (78 kg)  02/03/17 172 lb (78 kg)  08/04/16 168 lb (76.2 kg)    Physical Exam  Constitutional: She is oriented to person, place, and time. She appears well-developed and well-nourished.  HENT:  Head: Normocephalic and atraumatic.  Eyes: Conjunctivae and EOM are normal.  Neck: Normal range of motion.  Cardiovascular: Normal rate, regular rhythm and normal heart sounds.   Pulmonary/Chest: Effort normal and breath sounds normal.  Musculoskeletal: Normal range of motion.  Neurological: She is alert and oriented to person, place, and time.  Skin: No erythema.  Psychiatric: She has a normal mood and affect. Her behavior is normal. Judgment and thought content normal.    Results for orders placed or performed in visit on 02/03/17  Microscopic Examination  Result Value Ref  Range   WBC, UA 0-5 0 - 5 /hpf   RBC, UA None seen 0 - 2 /hpf   Epithelial Cells (non renal) 0-10 0 - 10 /hpf   Bacteria, UA None seen None seen/Few  CBC with Differential/Platelet  Result Value Ref Range   WBC 4.5 3.4 - 10.8 x10E3/uL   RBC 4.71 3.77 - 5.28 x10E6/uL   Hemoglobin 14.1 11.1 - 15.9 g/dL   Hematocrit 43.3 34.0 - 46.6 %   MCV 92 79 - 97 fL   MCH 29.9 26.6 - 33.0 pg   MCHC 32.6 31.5 - 35.7 g/dL   RDW 13.6 12.3 - 15.4 %   Platelets 283 150 - 379 x10E3/uL   Neutrophils 63 Not Estab. %   Lymphs 28 Not Estab. %   Monocytes 7 Not Estab. %   Eos 2 Not Estab. %   Basos 0 Not Estab. %   Neutrophils Absolute 2.9 1.4 - 7.0 x10E3/uL   Lymphocytes Absolute 1.3 0.7 - 3.1 x10E3/uL   Monocytes Absolute 0.3 0.1 - 0.9 x10E3/uL   EOS (ABSOLUTE) 0.1 0.0 - 0.4 x10E3/uL   Basophils Absolute 0.0 0.0 - 0.2 x10E3/uL   Immature Granulocytes 0 Not Estab. %   Immature Grans (Abs) 0.0 0.0 - 0.1 x10E3/uL  Comprehensive metabolic panel  Result Value Ref Range   Glucose 114 (H) 65 - 99 mg/dL   BUN 14 8 - 27 mg/dL   Creatinine, Ser  0.71 0.57 - 1.00 mg/dL   GFR calc non Af Amer 92 >59 mL/min/1.73   GFR calc Af Amer 106 >59 mL/min/1.73   BUN/Creatinine Ratio 20 12 - 28   Sodium 139 134 - 144 mmol/L   Potassium 4.4 3.5 - 5.2 mmol/L   Chloride 100 96 - 106 mmol/L   CO2 25 18 - 29 mmol/L   Calcium 9.6 8.7 - 10.3 mg/dL   Total Protein 6.7 6.0 - 8.5 g/dL   Albumin 4.3 3.6 - 4.8 g/dL   Globulin, Total 2.4 1.5 - 4.5 g/dL   Albumin/Globulin Ratio 1.8 1.2 - 2.2   Bilirubin Total 0.4 0.0 - 1.2 mg/dL   Alkaline Phosphatase 76 39 - 117 IU/L   AST 19 0 - 40 IU/L   ALT 18 0 - 32 IU/L  Lipid panel  Result Value Ref Range   Cholesterol, Total 217 (H) 100 - 199 mg/dL   Triglycerides 205 (H) 0 - 149 mg/dL   HDL 53 >39 mg/dL   VLDL Cholesterol Cal 41 (H) 5 - 40 mg/dL   LDL Calculated 123 (H) 0 - 99 mg/dL   Chol/HDL Ratio 4.1 0.0 - 4.4 ratio  TSH  Result Value Ref Range   TSH 2.590 0.450 - 4.500  uIU/mL  Urinalysis, Routine w reflex microscopic  Result Value Ref Range   Specific Gravity, UA 1.020 1.005 - 1.030   pH, UA 6.5 5.0 - 7.5   Color, UA Yellow Yellow   Appearance Ur Clear Clear   Leukocytes, UA Negative Negative   Protein, UA Negative Negative/Trace   Glucose, UA Negative Negative   Ketones, UA Negative Negative   RBC, UA 1+ (A) Negative   Bilirubin, UA Negative Negative   Urobilinogen, Ur 0.2 0.2 - 1.0 mg/dL   Nitrite, UA Negative Negative   Microscopic Examination See below:       Assessment & Plan:   Problem List Items Addressed This Visit      Cardiovascular and Mediastinum   Essential hypertension, benign - Primary    The current medical regimen is effective;  continue present plan and medications.       Relevant Orders   Basic metabolic panel     Other   Hyperlipemia    The current medical regimen is effective;  continue present plan and medications.       Relevant Orders   Lipid panel   ALT   AST    Other Visit Diagnoses    Needs flu shot       Relevant Orders   Flu Vaccine QUAD 36+ mos IM (Completed)       Follow up plan: Return in about 6 months (around 02/03/2018) for Physical Exam.

## 2017-08-06 ENCOUNTER — Encounter: Payer: Self-pay | Admitting: Family Medicine

## 2017-08-06 LAB — LIPID PANEL
CHOLESTEROL TOTAL: 205 mg/dL — AB (ref 100–199)
Chol/HDL Ratio: 4.6 ratio — ABNORMAL HIGH (ref 0.0–4.4)
HDL: 45 mg/dL (ref >39)
LDL Calculated: 123 mg/dL — ABNORMAL HIGH (ref 0–99)
TRIGLYCERIDES: 183 mg/dL — AB (ref 0–149)
VLDL Cholesterol Cal: 37 mg/dL (ref 5–40)

## 2017-08-06 LAB — BASIC METABOLIC PANEL
BUN/Creatinine Ratio: 18 (ref 12–28)
BUN: 14 mg/dL (ref 8–27)
CHLORIDE: 102 mmol/L (ref 96–106)
CO2: 25 mmol/L (ref 20–29)
Calcium: 9.4 mg/dL (ref 8.7–10.3)
Creatinine, Ser: 0.8 mg/dL (ref 0.57–1.00)
GFR calc Af Amer: 91 mL/min/{1.73_m2} (ref >59)
GFR, EST NON AFRICAN AMERICAN: 79 mL/min/{1.73_m2} (ref >59)
GLUCOSE: 107 mg/dL — AB (ref 65–99)
POTASSIUM: 4.2 mmol/L (ref 3.5–5.2)
Sodium: 141 mmol/L (ref 134–144)

## 2017-08-06 LAB — AST: AST: 20 IU/L (ref 0–40)

## 2017-08-06 LAB — ALT: ALT: 17 IU/L (ref 0–32)

## 2017-09-28 ENCOUNTER — Encounter: Payer: Self-pay | Admitting: Family Medicine

## 2017-09-28 ENCOUNTER — Ambulatory Visit: Payer: BLUE CROSS/BLUE SHIELD | Admitting: Family Medicine

## 2017-09-28 VITALS — BP 169/84 | HR 78 | Temp 98.2°F | Wt 176.0 lb

## 2017-09-28 DIAGNOSIS — J019 Acute sinusitis, unspecified: Secondary | ICD-10-CM | POA: Diagnosis not present

## 2017-09-28 MED ORDER — SUMATRIPTAN SUCCINATE 100 MG PO TABS: 100.0000 mg | ORAL_TABLET | ORAL | 3 refills | Status: DC | PRN

## 2017-09-28 MED ORDER — HYDROCOD POLST-CPM POLST ER 10-8 MG/5ML PO SUER: 2.5000 mL | Freq: Two times a day (BID) | ORAL | 0 refills | Status: DC | PRN

## 2017-09-28 MED ORDER — AMOXICILLIN-POT CLAVULANATE 875-125 MG PO TABS: 1.0000 | ORAL_TABLET | Freq: Two times a day (BID) | ORAL | 0 refills | Status: DC

## 2017-09-28 NOTE — Progress Notes (Signed)
BP (!) 169/84   Pulse 78   Temp 98.2 F (36.8 C) (Oral)   Wt 176 lb (79.8 kg)   SpO2 98%   BMI 29.50 kg/m    Subjective:    Patient ID: Brenda Wagner, female    DOB: Feb 23, 1956, 61 y.o.   MRN: [id number]  HPI: Brenda Wagner is a 61 y.o. female  Chief Complaint  Patient presents with  . Sinusitis    Right side   Patient with marked sinusitis symptoms  Feeling bad this been ongoing for over a week  Right facial pressure tenderness has been trying multiple over-the-counter  Medications. Has had  Multiple systemic effects of just feeling bad low-grade fevers and generalized aches. Relevant past medical, surgical, family and social history reviewed and updated as indicated. Interim medical history since our last visit reviewed. Allergies and medications reviewed and updated.  Review of Systems  Constitutional: Positive for chills, diaphoresis, fatigue and fever.  HENT: Positive for congestion, postnasal drip, rhinorrhea, sinus pressure, sinus pain, sneezing and sore throat.   Respiratory: Positive for cough and choking.   Cardiovascular: Negative.     Per HPI unless specifically indicated above     Objective:    BP (!) 169/84   Pulse 78   Temp 98.2 F (36.8 C) (Oral)   Wt 176 lb (79.8 kg)   SpO2 98%   BMI 29.50 kg/m   Wt Readings from Last 3 Encounters:  09/28/17 176 lb (79.8 kg)  08/05/17 172 lb (78 kg)  02/03/17 172 lb (78 kg)    Physical Exam  Constitutional: She is oriented to person, place, and time. She appears well-developed and well-nourished.  HENT:  Head: Normocephalic and atraumatic.  Right Ear: External ear normal.  Left Ear: External ear normal.  Mouth/Throat: Oropharyngeal exudate present.  Eyes: Conjunctivae and EOM are normal.  Neck: Normal range of motion.  Cardiovascular: Normal rate, regular rhythm and normal heart sounds.  Pulmonary/Chest: Effort normal and breath sounds normal.  Musculoskeletal: Normal range of motion.  Neurological:  She is alert and oriented to person, place, and time.  Skin: No erythema.  Psychiatric: She has a normal mood and affect. Her behavior is normal. Judgment and thought content normal.    Results for orders placed or performed in visit on 93/71/69  Basic metabolic panel  Result Value Ref Range   Glucose 107 (H) 65 - 99 mg/dL   BUN 14 8 - 27 mg/dL   Creatinine, Ser 0.80 0.57 - 1.00 mg/dL   GFR calc non Af Amer 79 >59 mL/min/1.73   GFR calc Af Amer 91 >59 mL/min/1.73   BUN/Creatinine Ratio 18 12 - 28   Sodium 141 134 - 144 mmol/L   Potassium 4.2 3.5 - 5.2 mmol/L   Chloride 102 96 - 106 mmol/L   CO2 25 20 - 29 mmol/L   Calcium 9.4 8.7 - 10.3 mg/dL  Lipid panel  Result Value Ref Range   Cholesterol, Total 205 (H) 100 - 199 mg/dL   Triglycerides 183 (H) 0 - 149 mg/dL   HDL 45 >39 mg/dL   VLDL Cholesterol Cal 37 5 - 40 mg/dL   LDL Calculated 123 (H) 0 - 99 mg/dL   Chol/HDL Ratio 4.6 (H) 0.0 - 4.4 ratio  AST  Result Value Ref Range   AST 20 0 - 40 IU/L  ALT  Result Value Ref Range   ALT 17 0 - 32 IU/L      Assessment & Plan:  Problem List Items Addressed This Visit    None    Visit Diagnoses    Acute sinusitis, recurrence not specified, unspecified location    -  Primary   Relevant Medications   amoxicillin-clavulanate (AUGMENTIN) 875-125 MG tablet   chlorpheniramine-HYDROcodone (TUSSIONEX PENNKINETIC ER) 10-8 MG/5ML SUER       Follow up plan: Return for As scheduled.

## 2018-02-04 ENCOUNTER — Encounter: Payer: BLUE CROSS/BLUE SHIELD | Admitting: Family Medicine

## 2018-02-07 ENCOUNTER — Other Ambulatory Visit: Payer: Self-pay | Admitting: Family Medicine

## 2018-02-08 ENCOUNTER — Other Ambulatory Visit: Payer: Self-pay | Admitting: Family Medicine

## 2019-10-20 ENCOUNTER — Other Ambulatory Visit: Payer: Self-pay | Admitting: Nurse Practitioner

## 2019-10-20 ENCOUNTER — Telehealth: Payer: Self-pay

## 2019-10-20 DIAGNOSIS — Z1231 Encounter for screening mammogram for malignant neoplasm of breast: Secondary | ICD-10-CM

## 2019-10-20 NOTE — Telephone Encounter (Signed)
Mammogram ordered.  Will see her in March and please see if she can bring [facility] records to [place].  Thanks.

## 2019-10-20 NOTE — Telephone Encounter (Signed)
Please see message below, appt scheduled for March.

## 2019-10-20 NOTE — Telephone Encounter (Signed)
Patient was last seen by Dr.Crissman 09/2017, can an order be placed for her mammogram or does she need to have a visit first? Copied from [place] [phone]. Topic: Referral - Request for Referral >> Oct 20, 2019 10:16 AM Scherrie Gerlach wrote: Pt needs referral for mammogram sent to [facility].

## 2019-10-20 NOTE — Telephone Encounter (Signed)
Needs visit, would benefit from annual physical visit for labs and mammogram order.  Thank you.

## 2019-10-20 NOTE — Telephone Encounter (Signed)
Called and left a detailed message for patient.  

## 2019-10-20 NOTE — Telephone Encounter (Signed)
Patient was seen in November un [employer] for a annual exam, per insurance, she is due for a 6 month follow up in march 2021, would like to know if she can go ahead and do the mammo and schedule the follow up in march

## 2019-11-18 ENCOUNTER — Ambulatory Visit
Admission: RE | Admit: 2019-11-18 | Discharge: 2019-11-18 | Disposition: A | Discharge status: Home / Self Care | Payer: Medicare Other | Source: Ambulatory Visit | Attending: Nurse Practitioner | Admitting: Nurse Practitioner

## 2019-11-18 DIAGNOSIS — Z1231 Encounter for screening mammogram for malignant neoplasm of breast: Secondary | ICD-10-CM | POA: Diagnosis present

## 2019-11-18 NOTE — Progress Notes (Signed)
Contacted via MyChart

## 2019-12-30 ENCOUNTER — Encounter: Payer: Self-pay | Admitting: Nurse Practitioner

## 2019-12-30 DIAGNOSIS — C4491 Basal cell carcinoma of skin, unspecified: Secondary | ICD-10-CM | POA: Insufficient documentation

## 2020-01-04 ENCOUNTER — Other Ambulatory Visit: Payer: Self-pay

## 2020-01-04 ENCOUNTER — Ambulatory Visit (INDEPENDENT_AMBULATORY_CARE_PROVIDER_SITE_OTHER): Payer: Medicare Other | Admitting: Nurse Practitioner

## 2020-01-04 ENCOUNTER — Encounter: Payer: Self-pay | Admitting: Nurse Practitioner

## 2020-01-04 VITALS — BP 136/82 | HR 76 | Temp 98.4°F | Ht 64.17 in | Wt 177.0 lb

## 2020-01-04 DIAGNOSIS — E782 Mixed hyperlipidemia: Secondary | ICD-10-CM | POA: Diagnosis not present

## 2020-01-04 DIAGNOSIS — I10 Essential (primary) hypertension: Secondary | ICD-10-CM | POA: Diagnosis not present

## 2020-01-04 DIAGNOSIS — E559 Vitamin D deficiency, unspecified: Secondary | ICD-10-CM | POA: Insufficient documentation

## 2020-01-04 DIAGNOSIS — G43109 Migraine with aura, not intractable, without status migrainosus: Secondary | ICD-10-CM

## 2020-01-04 MED ORDER — SUMATRIPTAN SUCCINATE 100 MG PO TABS: 100.0000 mg | ORAL_TABLET | ORAL | 3 refills | Status: DC | PRN

## 2020-01-04 NOTE — Assessment & Plan Note (Signed)
Chronic, ongoing.  Continue current medication regimen and adjust as needed. Lipid panel today. 

## 2020-01-04 NOTE — Progress Notes (Signed)
BP 136/82 (BP Location: Left Arm, Patient Position: Sitting, Cuff Size: Normal)   Pulse 76   Temp 98.4 F (36.9 C) (Oral)   Ht 5' 4.17" (1.63 m)   Wt 177 lb (80.3 kg)   SpO2 95%   BMI 30.22 kg/m    Subjective:    Patient ID: Brenda Wagner, female    DOB: [birth year], 64 y.o.   MRN: [id number]  HPI: Brenda Wagner is a 65 y.o. female  Chief Complaint  Patient presents with  . Follow-up    6 mo  . Medication Refill    Imitrex   HYPERTENSION / HYPERLIPIDEMIA Continues on Losartan and Atorvastatin. Satisfied with current treatment? yes Duration of hypertension: chronic BP monitoring frequency: daily BP range: 130/70 range at home BP medication side effects: no Duration of hyperlipidemia: chronic Cholesterol medication side effects: no Cholesterol supplements: none Medication compliance: good compliance Aspirin: no Recent stressors: no Recurrent headaches: no Visual changes: no Palpitations: no Dyspnea: no Chest pain: no Lower extremity edema: no Dizzy/lightheaded: no   MIGRAINES Has one every two years -- this is much improved from her previous when younger.  None present today. Duration: chronic Headache status at time of visit: asymptomatic Treatments attempted: Treatments attempted: triptans   Aura: yes Nausea:  yes Vomiting: yes Photophobia:  yes Phonophobia:  no Effect on social functioning:  yes Numbers of missed days of school/work each month: none Confusion:  no Gait disturbance/ataxia:  no Behavioral changes:  no Fevers:  no  Relevant past medical, surgical, family and social history reviewed and updated as indicated. Interim medical history since our last visit reviewed. Allergies and medications reviewed and updated.  Review of Systems  Constitutional: Negative for activity change, appetite change, diaphoresis, fatigue and fever.  Respiratory: Negative for cough, chest tightness and shortness of breath.   Cardiovascular: Negative for chest  pain, palpitations and leg swelling.  Gastrointestinal: Negative.   Neurological: Negative.   Psychiatric/Behavioral: Negative.     Per HPI unless specifically indicated above     Objective:    BP 136/82 (BP Location: Left Arm, Patient Position: Sitting, Cuff Size: Normal)   Pulse 76   Temp 98.4 F (36.9 C) (Oral)   Ht 5' 4.17" (1.63 m)   Wt 177 lb (80.3 kg)   SpO2 95%   BMI 30.22 kg/m   Wt Readings from Last 3 Encounters:  01/04/20 177 lb (80.3 kg)  09/28/17 176 lb (79.8 kg)  08/05/17 172 lb (78 kg)    Physical Exam Vitals and nursing note reviewed.  Constitutional:      General: She is awake. She is not in acute distress.    Appearance: She is well-developed, well-groomed and overweight. She is not ill-appearing.  HENT:     Head: Normocephalic.     Right Ear: Hearing normal.     Left Ear: Hearing normal.  Eyes:     General: Lids are normal.        Right eye: No discharge.        Left eye: No discharge.     Conjunctiva/sclera: Conjunctivae normal.     Pupils: Pupils are equal, round, and reactive to light.  Neck:     Thyroid: No thyromegaly.     Vascular: No carotid bruit.  Cardiovascular:     Rate and Rhythm: Normal rate and regular rhythm.     Heart sounds: Normal heart sounds. No murmur. No gallop.   Pulmonary:     Effort: Pulmonary effort is  normal. No accessory muscle usage or respiratory distress.     Breath sounds: Normal breath sounds.  Abdominal:     General: Bowel sounds are normal.     Palpations: Abdomen is soft.  Musculoskeletal:     Cervical back: Normal range of motion and neck supple.     Right lower leg: No edema.     Left lower leg: No edema.  Skin:    General: Skin is warm and dry.  Neurological:     Mental Status: She is alert and oriented to person, place, and time.  Psychiatric:        Attention and Perception: Attention normal.        Mood and Affect: Mood normal.        Speech: Speech normal.        Behavior: Behavior normal.  Behavior is cooperative.        Judgment: Judgment normal.     Results for orders placed or performed in visit on [id number]  Basic metabolic panel  Result Value Ref Range   Glucose 107 (H) 65 - 99 mg/dL   BUN 14 8 - 27 mg/dL   Creatinine, Ser 0.80 0.57 - 1.00 mg/dL   GFR calc non Af Amer 79 >59 mL/min/1.73   GFR calc Af Amer 91 >59 mL/min/1.73   BUN/Creatinine Ratio 18 12 - 28   Sodium 141 134 - 144 mmol/L   Potassium 4.2 3.5 - 5.2 mmol/L   Chloride 102 96 - 106 mmol/L   CO2 25 20 - 29 mmol/L   Calcium 9.4 8.7 - 10.3 mg/dL  Lipid panel  Result Value Ref Range   Cholesterol, Total 205 (H) 100 - 199 mg/dL   Triglycerides 183 (H) 0 - 149 mg/dL   HDL 45 >39 mg/dL   VLDL Cholesterol Cal 37 5 - 40 mg/dL   LDL Calculated 123 (H) 0 - 99 mg/dL   Chol/HDL Ratio 4.6 (H) 0.0 - 4.4 ratio  AST  Result Value Ref Range   AST 20 0 - 40 IU/L  ALT  Result Value Ref Range   ALT 17 0 - 32 IU/L      Assessment & Plan:   Problem List Items Addressed This Visit      Cardiovascular and Mediastinum   Essential hypertension, benign - Primary    Chronic, stable with BP at goal in office and at home.  Continue current medication regimen and adjust as needed.  Recommend she continue to monitor BP at home regularly. BMP today.  Return in 6 months.      Relevant Medications   atorvastatin (LIPITOR) 20 MG tablet   Other Relevant Orders   Basic metabolic panel   Migraine headache with aura    Chronic, stable, minimal migraines for which Imitrex offers benefit.  Refills sent and consider adjustment of regimen in future.      Relevant Medications   atorvastatin (LIPITOR) 20 MG tablet   SUMAtriptan (IMITREX) 100 MG tablet     Other   Hyperlipemia    Chronic, ongoing.  Continue current medication regimen and adjust as needed. Lipid panel today.      Relevant Medications   atorvastatin (LIPITOR) 20 MG tablet   Other Relevant Orders   Lipid Panel w/o Chol/HDL Ratio       Follow up  plan: Return in about 6 months (around 07/06/2020) for HTN/HLD, Vit D.

## 2020-01-04 NOTE — Patient Instructions (Signed)
DASH Eating Plan DASH stands for "Dietary Approaches to Stop Hypertension." The DASH eating plan is a healthy eating plan that has been shown to reduce high blood pressure (hypertension). It may also reduce your risk for type 2 diabetes, heart disease, and stroke. The DASH eating plan may also help with weight loss. What are tips for following this plan?  General guidelines  Avoid eating more than 2,300 mg (milligrams) of salt (sodium) a day. If you have hypertension, you may need to reduce your sodium intake to 1,500 mg a day.  Limit alcohol intake to no more than 1 drink a day for nonpregnant women and 2 drinks a day for men. One drink equals 12 oz of beer, 5 oz of wine, or 1 oz of hard liquor.  Work with your health care provider to maintain a healthy body weight or to lose weight. Ask what an ideal weight is for you.  Get at least 30 minutes of exercise that causes your heart to beat faster (aerobic exercise) most days of the week. Activities may include walking, swimming, or biking.  Work with your health care provider or diet and nutrition specialist (dietitian) to adjust your eating plan to your individual calorie needs. Reading food labels   Check food labels for the amount of sodium per serving. Choose foods with less than 5 percent of the Daily Value of sodium. Generally, foods with less than 300 mg of sodium per serving fit into this eating plan.  To find whole grains, look for the word "whole" as the first word in the ingredient list. Shopping  Buy products labeled as "low-sodium" or "no salt added."  Buy fresh foods. Avoid canned foods and premade or frozen meals. Cooking  Avoid adding salt when cooking. Use salt-free seasonings or herbs instead of table salt or sea salt. Check with your health care provider or pharmacist before using salt substitutes.  Do not fry foods. Cook foods using healthy methods such as baking, boiling, grilling, and broiling instead.  Cook with  heart-healthy oils, such as olive, canola, soybean, or sunflower oil. Meal planning  Eat a balanced diet that includes: ? 5 or more servings of fruits and vegetables each day. At each meal, try to fill half of your plate with fruits and vegetables. ? Up to 6-8 servings of whole grains each day. ? Less than 6 oz of lean meat, poultry, or fish each day. A 3-oz serving of meat is about the same size as a deck of cards. One egg equals 1 oz. ? 2 servings of low-fat dairy each day. ? A serving of nuts, seeds, or beans 5 times each week. ? Heart-healthy fats. Healthy fats called Omega-3 fatty acids are found in foods such as flaxseeds and coldwater fish, like sardines, salmon, and mackerel.  Limit how much you eat of the following: ? Canned or prepackaged foods. ? Food that is high in trans fat, such as fried foods. ? Food that is high in saturated fat, such as fatty meat. ? Sweets, desserts, sugary drinks, and other foods with added sugar. ? Full-fat dairy products.  Do not salt foods before eating.  Try to eat at least 2 vegetarian meals each week.  Eat more home-cooked food and less restaurant, buffet, and fast food.  When eating at a restaurant, ask that your food be prepared with less salt or no salt, if possible. What foods are recommended? The items listed may not be a complete list. Talk with your dietitian about   what dietary choices are best for you. Grains Whole-grain or whole-wheat bread. Whole-grain or whole-wheat pasta. Brown rice. Oatmeal. Quinoa. Bulgur. Whole-grain and low-sodium cereals. Pita bread. Low-fat, low-sodium crackers. Whole-wheat flour tortillas. Vegetables Fresh or frozen vegetables (raw, steamed, roasted, or grilled). Low-sodium or reduced-sodium tomato and vegetable juice. Low-sodium or reduced-sodium tomato sauce and tomato paste. Low-sodium or reduced-sodium canned vegetables. Fruits All fresh, dried, or frozen fruit. Canned fruit in natural juice (without  added sugar). Meat and other protein foods Skinless chicken or turkey. Ground chicken or turkey. Pork with fat trimmed off. Fish and seafood. Egg whites. Dried beans, peas, or lentils. Unsalted nuts, nut butters, and seeds. Unsalted canned beans. Lean cuts of beef with fat trimmed off. Low-sodium, lean deli meat. Dairy Low-fat (1%) or fat-free (skim) milk. Fat-free, low-fat, or reduced-fat cheeses. Nonfat, low-sodium ricotta or cottage cheese. Low-fat or nonfat yogurt. Low-fat, low-sodium cheese. Fats and oils Soft margarine without trans fats. Vegetable oil. Low-fat, reduced-fat, or light mayonnaise and salad dressings (reduced-sodium). Canola, safflower, olive, soybean, and sunflower oils. Avocado. Seasoning and other foods Herbs. Spices. Seasoning mixes without salt. Unsalted popcorn and pretzels. Fat-free sweets. What foods are not recommended? The items listed may not be a complete list. Talk with your dietitian about what dietary choices are best for you. Grains Baked goods made with fat, such as croissants, muffins, or some breads. Dry pasta or rice meal packs. Vegetables Creamed or fried vegetables. Vegetables in a cheese sauce. Regular canned vegetables (not low-sodium or reduced-sodium). Regular canned tomato sauce and paste (not low-sodium or reduced-sodium). Regular tomato and vegetable juice (not low-sodium or reduced-sodium). Pickles. Olives. Fruits Canned fruit in a light or heavy syrup. Fried fruit. Fruit in cream or butter sauce. Meat and other protein foods Fatty cuts of meat. Ribs. Fried meat. Bacon. Sausage. Bologna and other processed lunch meats. Salami. Fatback. Hotdogs. Bratwurst. Salted nuts and seeds. Canned beans with added salt. Canned or smoked fish. Whole eggs or egg yolks. Chicken or turkey with skin. Dairy Whole or 2% milk, cream, and half-and-half. Whole or full-fat cream cheese. Whole-fat or sweetened yogurt. Full-fat cheese. Nondairy creamers. Whipped toppings.  Processed cheese and cheese spreads. Fats and oils Butter. Stick margarine. Lard. Shortening. Ghee. Bacon fat. Tropical oils, such as coconut, palm kernel, or palm oil. Seasoning and other foods Salted popcorn and pretzels. Onion salt, garlic salt, seasoned salt, table salt, and sea salt. Worcestershire sauce. Tartar sauce. Barbecue sauce. Teriyaki sauce. Soy sauce, including reduced-sodium. Steak sauce. Canned and packaged gravies. Fish sauce. Oyster sauce. Cocktail sauce. Horseradish that you find on the shelf. Ketchup. Mustard. Meat flavorings and tenderizers. Bouillon cubes. Hot sauce and Tabasco sauce. Premade or packaged marinades. Premade or packaged taco seasonings. Relishes. Regular salad dressings. Where to find more information:  National Heart, Lung, and Blood Institute: www.nhlbi.nih.gov  American Heart Association: www.heart.org Summary  The DASH eating plan is a healthy eating plan that has been shown to reduce high blood pressure (hypertension). It may also reduce your risk for type 2 diabetes, heart disease, and stroke.  With the DASH eating plan, you should limit salt (sodium) intake to 2,300 mg a day. If you have hypertension, you may need to reduce your sodium intake to 1,500 mg a day.  When on the DASH eating plan, aim to eat more fresh fruits and vegetables, whole grains, lean proteins, low-fat dairy, and heart-healthy fats.  Work with your health care provider or diet and nutrition specialist (dietitian) to adjust your eating plan to your   individual calorie needs. This information is not intended to replace advice given to you by your health care provider. Make sure you discuss any questions you have with your health care provider. Document Revised: 09/11/2017 Document Reviewed: 09/22/2016 Elsevier Patient Education  2020 Elsevier Inc.  

## 2020-01-04 NOTE — Assessment & Plan Note (Signed)
Chronic, stable, minimal migraines for which Imitrex offers benefit.  Refills sent and consider adjustment of regimen in future.

## 2020-01-04 NOTE — Assessment & Plan Note (Signed)
Chronic, stable with BP at goal in office and at home.  Continue current medication regimen and adjust as needed.  Recommend she continue to monitor BP at home regularly. BMP today.  Return in 6 months.

## 2020-01-05 LAB — BASIC METABOLIC PANEL
BUN/Creatinine Ratio: 20 (ref 12–28)
BUN: 16 mg/dL (ref 8–27)
CO2: 26 mmol/L (ref 20–29)
Calcium: 9.8 mg/dL (ref 8.7–10.3)
Chloride: 100 mmol/L (ref 96–106)
Creatinine, Ser: 0.81 mg/dL (ref 0.57–1.00)
GFR calc Af Amer: 88 mL/min/{1.73_m2} (ref >59)
GFR calc non Af Amer: 76 mL/min/{1.73_m2} (ref >59)
Glucose: 111 mg/dL — ABNORMAL HIGH (ref 65–99)
Potassium: 4.8 mmol/L (ref 3.5–5.2)
Sodium: 138 mmol/L (ref 134–144)

## 2020-01-05 LAB — LIPID PANEL W/O CHOL/HDL RATIO
Cholesterol, Total: 182 mg/dL (ref 100–199)
HDL: 49 mg/dL (ref >39)
LDL Chol Calc (NIH): 101 mg/dL — ABNORMAL HIGH (ref 0–99)
Triglycerides: 188 mg/dL — ABNORMAL HIGH (ref 0–149)
VLDL Cholesterol Cal: 32 mg/dL (ref 5–40)

## 2020-01-05 NOTE — Progress Notes (Signed)
Contacted via MyChart The 10-year ASCVD risk score Mikey Bussing DC Jr., et al., 2013) is: 8.4%   Values used to calculate the score:     Age: 64 years     Sex: Female     Is Non-Hispanic African American: No     Diabetic: No     Tobacco smoker: No     Systolic Blood Pressure: [id number] mmHg     Is BP treated: Yes     HDL Cholesterol: 49 mg/dL     Total Cholesterol: 182 mg/dL

## 2020-01-16 ENCOUNTER — Other Ambulatory Visit: Payer: Self-pay | Admitting: Nurse Practitioner

## 2020-01-16 DIAGNOSIS — I10 Essential (primary) hypertension: Secondary | ICD-10-CM

## 2020-01-16 MED ORDER — LOSARTAN POTASSIUM 100 MG PO TABS: 100.0000 mg | ORAL_TABLET | Freq: Every day | ORAL | 1 refills | Status: DC

## 2020-01-16 MED ORDER — FLUTICASONE PROPIONATE 50 MCG/ACT NA SUSP: NASAL | 6 refills | Status: DC

## 2020-01-16 NOTE — Telephone Encounter (Signed)
Requested Prescriptions  Pending Prescriptions Disp Refills  . fluticasone (FLONASE) 50 MCG/ACT nasal spray 16 g 6    Sig: USE 2 SPRAY(S) IN EACH NOSTRIL ONCE DAILY     Ear, Nose, and Throat: Nasal Preparations - Corticosteroids Passed - 01/16/2020 10:13 AM      Passed - Valid encounter within last 12 months    Recent Outpatient Visits          1 week ago Essential hypertension, benign   [place], [place] T, NP   2 years ago Acute sinusitis, recurrence not specified, unspecified location   [facility] [place], Jeannette How, MD   2 years ago Essential hypertension, benign   [facility] [place], Jeannette How, MD   2 years ago Annual physical exam   [place], Jeannette How, MD   3 years ago Essential hypertension, benign   [place], Jeannette How, MD      Future Appointments            In 5 months Cannady, Barbaraann Faster, NP [facility]           . losartan (COZAAR) 100 MG tablet 90 tablet 1    Sig: Take 1 tablet (100 mg total) by mouth daily.     Cardiovascular:  Angiotensin Receptor Blockers Passed - 01/16/2020 10:13 AM      Passed - Cr in normal range and within 180 days    Creatinine, Ser  Date Value Ref Range Status  01/04/2020 0.81 0.57 - 1.00 mg/dL Final         Passed - K in normal range and within 180 days    Potassium  Date Value Ref Range Status  01/04/2020 4.8 3.5 - 5.2 mmol/L Final         Passed - Patient is not pregnant      Passed - Last BP in normal range    BP Readings from Last 1 Encounters:  01/04/20 136/82         Passed - Valid encounter within last 6 months    Recent Outpatient Visits          1 week ago Essential hypertension, benign   [place], [employer] T, NP   2 years ago Acute sinusitis, recurrence not specified, unspecified location   [facility] [place], Jeannette How, MD   2 years ago Essential hypertension, benign   [place], Jeannette How, MD   2 years ago Annual physical exam   [place], Jeannette How, MD   3 years ago Essential hypertension, benign   [place], Jeannette How, MD      Future Appointments            In 5 months Cannady, Barbaraann Faster, NP [facility], [place]

## 2020-01-16 NOTE — Telephone Encounter (Signed)
Copied from [place] [phone]. Topic: Quick Communication - Rx Refill/Question >> Jan 16, 2020 10:08 AM Mcneil, Jacinto Reap wrote: Medication: fluticasone (FLONASE) 50 MCG/ACT nasal spray and losartan (COZAAR) 100 MG tablet  Has the patient contacted their pharmacy? no  Preferred Pharmacy (with phone number or street name): [id number] [place], [place]. [address]  Phone: [phone]  Fax: [phone]  Agent: Please be advised that RX refills may take up to 3 business days. We ask that you follow-up with your pharmacy.

## 2020-02-22 ENCOUNTER — Other Ambulatory Visit: Payer: Self-pay | Admitting: Nurse Practitioner

## 2020-02-22 MED ORDER — ATORVASTATIN CALCIUM 20 MG PO TABS: 20.0000 mg | ORAL_TABLET | Freq: Every day | ORAL | 4 refills | Status: DC

## 2020-02-22 NOTE — Telephone Encounter (Signed)
Requested medication (s) are due for refill today -yes  Requested medication (s) are on the active medication list -yes  Future visit scheduled -yes  Last refill: 1 month  Notes to clinic: Patient requesting RF prescribed by historical provider  Requested Prescriptions  Pending Prescriptions Disp Refills   atorvastatin (LIPITOR) 20 MG tablet      Sig: Take 1 tablet (20 mg total) by mouth daily.      Cardiovascular:  Antilipid - Statins Failed - 02/22/2020  1:47 PM      Failed - LDL in normal range and within 360 days    LDL Chol Calc (NIH)  Date Value Ref Range Status  01/04/2020 101 (H) 0 - 99 mg/dL Final          Failed - Triglycerides in normal range and within 360 days    Triglycerides  Date Value Ref Range Status  01/04/2020 188 (H) 0 - 149 mg/dL Final   Triglycerides Piccolo,Waived  Date Value Ref Range Status  08/04/2016 197 (H) <150 mg/dL Final    Comment:                            Normal                   <150                         Borderline High     150 - 199                         High                200 - 499                         Very High                >499           Passed - Total Cholesterol in normal range and within 360 days    Cholesterol, Total  Date Value Ref Range Status  01/04/2020 182 100 - 199 mg/dL Final   Cholesterol Piccolo, Waived  Date Value Ref Range Status  08/04/2016 200 (H) <200 mg/dL Final    Comment:                            Desirable                <200                         Borderline High      200- 239                         High                     >239           Passed - HDL in normal range and within 360 days    HDL  Date Value Ref Range Status  01/04/2020 49 >39 mg/dL Final          Passed - Patient is not pregnant      Passed - Valid encounter within last 12 months  Recent Outpatient Visits           1 month ago Essential hypertension, benign   [place], [place] T,  NP   2 years ago Acute sinusitis, recurrence not specified, unspecified location   [facility], Jeannette How, MD   2 years ago Essential hypertension, benign   [place], Jeannette How, MD   3 years ago Annual physical exam   [place], Jeannette How, MD   3 years ago Essential hypertension, benign   [place], Jeannette How, MD       Future Appointments             In 4 months Cannady, Barbaraann Faster, NP [facility]                Requested Prescriptions  Pending Prescriptions Disp Refills   atorvastatin (LIPITOR) 20 MG tablet      Sig: Take 1 tablet (20 mg total) by mouth daily.      Cardiovascular:  Antilipid - Statins Failed - 02/22/2020  1:47 PM      Failed - LDL in normal range and within 360 days    LDL Chol Calc (NIH)  Date Value Ref Range Status  01/04/2020 101 (H) 0 - 99 mg/dL Final          Failed - Triglycerides in normal range and within 360 days    Triglycerides  Date Value Ref Range Status  01/04/2020 188 (H) 0 - 149 mg/dL Final   Triglycerides Piccolo,Waived  Date Value Ref Range Status  08/04/2016 197 (H) <150 mg/dL Final    Comment:                            Normal                   <150                         Borderline High     150 - 199                         High                200 - 499                         Very High                >499           Passed - Total Cholesterol in normal range and within 360 days    Cholesterol, Total  Date Value Ref Range Status  01/04/2020 182 100 - 199 mg/dL Final   Cholesterol Piccolo, Waived  Date Value Ref Range Status  08/04/2016 200 (H) <200 mg/dL Final    Comment:                            Desirable                <200                         Borderline High      200- 239  High                     >239           Passed - HDL in normal range and within 360 days    HDL  Date  Value Ref Range Status  01/04/2020 49 >39 mg/dL Final          Passed - Patient is not pregnant      Passed - Valid encounter within last 12 months    Recent Outpatient Visits           1 month ago Essential hypertension, benign   [place], [place] T, NP   2 years ago Acute sinusitis, recurrence not specified, unspecified location   [facility] [place], Jeannette How, MD   2 years ago Essential hypertension, benign   [place], Jeannette How, MD   3 years ago Annual physical exam   [place], Jeannette How, MD   3 years ago Essential hypertension, benign   [place], Jeannette How, MD       Future Appointments             In 4 months Cannady, Barbaraann Faster, NP [facility], [place]

## 2020-02-22 NOTE — Telephone Encounter (Signed)
Medication Refill - Medication: atorvastatin (LIPITOR) 20 MG tablet [id number]   Has the patient contacted their pharmacy? No. (Agent: If no, request that the patient contact the pharmacy for the refill.) (Agent: If yes, when and what did the pharmacy advise?) Pt. Didn't contact pharmacy because she was given a 90-day refill and states her PCP usually  contacts the pharmacy.  Preferred Pharmacy (with phone number or street name):[id number] [place], [place] - [address]  Agent: Please be advised that RX refills may take up to 3 business days. We ask that you follow-up with your pharmacy.

## 2020-03-22 ENCOUNTER — Encounter: Payer: Self-pay | Admitting: Nurse Practitioner

## 2020-05-22 ENCOUNTER — Encounter: Payer: Medicare Other | Admitting: Dermatology

## 2020-07-06 ENCOUNTER — Ambulatory Visit: Payer: Medicare Other | Admitting: Nurse Practitioner

## 2020-07-10 ENCOUNTER — Ambulatory Visit: Payer: Medicare Other | Admitting: Nurse Practitioner

## 2020-07-13 ENCOUNTER — Other Ambulatory Visit: Payer: Self-pay | Admitting: Family Medicine

## 2020-07-13 DIAGNOSIS — I10 Essential (primary) hypertension: Secondary | ICD-10-CM

## 2020-07-13 NOTE — Telephone Encounter (Signed)
Patient last seen 01/04/20 and has appointment 07/17/20.

## 2020-07-13 NOTE — Telephone Encounter (Signed)
Requested medication (s) are due for refill today: yes  Requested medication (s) are on the active medication list: yes  Last refill:  04/15/20  Future visit scheduled: yes  Notes to clinic:  historical provider    Requested Prescriptions  Pending Prescriptions Disp Refills   losartan (COZAAR) 100 MG tablet [Pharmacy Med Name: LOSARTAN POTASSIUM 100 MG TAB] 90 tablet 1    Sig: TAKE 1 TABLET BY MOUTH EVERY DAY      Cardiovascular:  Angiotensin Receptor Blockers Failed - 07/13/2020  6:57 AM      Failed - Cr in normal range and within 180 days    Creatinine, Ser  Date Value Ref Range Status  01/04/2020 0.81 0.57 - 1.00 mg/dL Final          Failed - K in normal range and within 180 days    Potassium  Date Value Ref Range Status  01/04/2020 4.8 3.5 - 5.2 mmol/L Final          Failed - Valid encounter within last 6 months    Recent Outpatient Visits           6 months ago Essential hypertension, benign   [place], [place] T, NP   2 years ago Acute sinusitis, recurrence not specified, unspecified location   [facility] [place], Jeannette How, MD   2 years ago Essential hypertension, benign   [place], Jeannette How, MD   3 years ago Annual physical exam   [facility] [place], Jeannette How, MD   3 years ago Essential hypertension, benign   [facility] [place], Jeannette How, MD       Future Appointments             In 4 days Venita Lick, NP [facility], [place]   In 1 month [name], MD [place] - Patient is not pregnant      Passed - Last BP in normal range    BP Readings from Last 1 Encounters:  01/04/20 136/82

## 2020-07-17 ENCOUNTER — Other Ambulatory Visit: Payer: Self-pay

## 2020-07-17 ENCOUNTER — Encounter: Payer: Self-pay | Admitting: Nurse Practitioner

## 2020-07-17 ENCOUNTER — Ambulatory Visit (INDEPENDENT_AMBULATORY_CARE_PROVIDER_SITE_OTHER): Payer: Medicare Other | Admitting: Nurse Practitioner

## 2020-07-17 VITALS — BP 130/84 | HR 66 | Temp 98.6°F | Ht 64.0 in | Wt 178.0 lb

## 2020-07-17 DIAGNOSIS — E559 Vitamin D deficiency, unspecified: Secondary | ICD-10-CM

## 2020-07-17 DIAGNOSIS — E782 Mixed hyperlipidemia: Secondary | ICD-10-CM

## 2020-07-17 DIAGNOSIS — Z23 Encounter for immunization: Secondary | ICD-10-CM | POA: Diagnosis not present

## 2020-07-17 DIAGNOSIS — E66811 Other obesity due to excess calories: Secondary | ICD-10-CM

## 2020-07-17 DIAGNOSIS — I10 Essential (primary) hypertension: Secondary | ICD-10-CM

## 2020-07-17 DIAGNOSIS — G43109 Migraine with aura, not intractable, without status migrainosus: Secondary | ICD-10-CM | POA: Diagnosis not present

## 2020-07-17 DIAGNOSIS — E6609 Other obesity due to excess calories: Secondary | ICD-10-CM

## 2020-07-17 DIAGNOSIS — Z6830 Body mass index (BMI) 30.0-30.9, adult: Secondary | ICD-10-CM

## 2020-07-17 DIAGNOSIS — Z780 Asymptomatic menopausal state: Secondary | ICD-10-CM

## 2020-07-17 DIAGNOSIS — E669 Obesity, unspecified: Secondary | ICD-10-CM | POA: Insufficient documentation

## 2020-07-17 NOTE — Patient Instructions (Addendum)
[facility] at [facility]  Address: [address], [place], [place] [address]  Phone: [phone]  Hypertension, Adult High blood pressure (hypertension) is when the force of blood pumping through the arteries is too strong. The arteries are the blood vessels that carry blood from the heart throughout the body. Hypertension forces the heart to work harder to pump blood and may cause arteries to become narrow or stiff. Untreated or uncontrolled hypertension can cause a heart attack, heart failure, a stroke, kidney disease, and other problems. A blood pressure reading consists of a higher number over a lower number. Ideally, your blood pressure should be below 120/80. The first ("top") number is called the systolic pressure. It is a measure of the pressure in your arteries as your heart beats. The second ("bottom") number is called the diastolic pressure. It is a measure of the pressure in your arteries as the heart relaxes. What are the causes? The exact cause of this condition is not known. There are some conditions that result in or are related to high blood pressure. What increases the risk? Some risk factors for high blood pressure are under your control. The following factors may make you more likely to develop this condition:  Smoking.  Having type 2 diabetes mellitus, high cholesterol, or both.  Not getting enough exercise or physical activity.  Being overweight.  Having too much fat, sugar, calories, or salt (sodium) in your diet.  Drinking too much alcohol. Some risk factors for high blood pressure may be difficult or impossible to change. Some of these factors include:  Having chronic kidney disease.  Having a family history of high blood pressure.  Age. Risk increases with age.  Race. You may be at higher risk if you are African American.  Gender. Men are at higher risk than women before age 55. After age 20, women are at higher risk than  men.  Having obstructive sleep apnea.  Stress. What are the signs or symptoms? High blood pressure may not cause symptoms. Very high blood pressure (hypertensive crisis) may cause:  Headache.  Anxiety.  Shortness of breath.  Nosebleed.  Nausea and vomiting.  Vision changes.  Severe chest pain.  Seizures. How is this diagnosed? This condition is diagnosed by measuring your blood pressure while you are seated, with your arm resting on a flat surface, your legs uncrossed, and your feet flat on the floor. The cuff of the blood pressure monitor will be placed directly against the skin of your upper arm at the level of your heart. It should be measured at least twice using the same arm. Certain conditions can cause a difference in blood pressure between your right and left arms. Certain factors can cause blood pressure readings to be lower or higher than normal for a short period of time:  When your blood pressure is higher when you are in a health care provider's office than when you are at home, this is called white coat hypertension. Most people with this condition do not need medicines.  When your blood pressure is higher at home than when you are in a health care provider's office, this is called masked hypertension. Most people with this condition may need medicines to control blood pressure. If you have a high blood pressure reading during one visit or you have normal blood pressure with other risk factors, you may be asked to:  Return on a different day to have your blood pressure checked again.  Monitor your  blood pressure at home for 1 week or longer. If you are diagnosed with hypertension, you may have other blood or imaging tests to help your health care provider understand your overall risk for other conditions. How is this treated? This condition is treated by making healthy lifestyle changes, such as eating healthy foods, exercising more, and reducing your alcohol  intake. Your health care provider may prescribe medicine if lifestyle changes are not enough to get your blood pressure under control, and if:  Your systolic blood pressure is above 130.  Your diastolic blood pressure is above 80. Your personal target blood pressure may vary depending on your medical conditions, your age, and other factors. Follow these instructions at home: Eating and drinking   Eat a diet that is high in fiber and potassium, and low in sodium, added sugar, and fat. An example eating plan is called the DASH (Dietary Approaches to Stop Hypertension) diet. To eat this way: ? Eat plenty of fresh fruits and vegetables. Try to fill one half of your plate at each meal with fruits and vegetables. ? Eat whole grains, such as whole-wheat pasta, brown rice, or whole-grain bread. Fill about one fourth of your plate with whole grains. ? Eat or drink low-fat dairy products, such as skim milk or low-fat yogurt. ? Avoid fatty cuts of meat, processed or cured meats, and poultry with skin. Fill about one fourth of your plate with lean proteins, such as fish, chicken without skin, beans, eggs, or tofu. ? Avoid pre-made and processed foods. These tend to be higher in sodium, added sugar, and fat.  Reduce your daily sodium intake. Most people with hypertension should eat less than 1,500 mg of sodium a day.  Do not drink alcohol if: ? Your health care provider tells you not to drink. ? You are pregnant, may be pregnant, or are planning to become pregnant.  If you drink alcohol: ? Limit how much you use to:  0-1 drink a day for women.  0-2 drinks a day for men. ? Be aware of how much alcohol is in your drink. In the [place] one drink equals one 12 oz bottle of beer (355 mL), one 5 oz glass of wine (148 mL), or one 1 oz glass of hard liquor (44 mL). Lifestyle   Work with your health care provider to maintain a healthy body weight or to lose weight. Ask what an ideal weight is for  you.  Get at least 30 minutes of exercise most days of the week. Activities may include walking, swimming, or biking.  Include exercise to strengthen your muscles (resistance exercise), such as Pilates or lifting weights, as part of your weekly exercise routine. Try to do these types of exercises for 30 minutes at least 3 days a week.  Do not use any products that contain nicotine or tobacco, such as cigarettes, e-cigarettes, and chewing tobacco. If you need help quitting, ask your health care provider.  Monitor your blood pressure at home as told by your health care provider.  Keep all follow-up visits as told by your health care provider. This is important. Medicines  Take over-the-counter and prescription medicines only as told by your health care provider. Follow directions carefully. Blood pressure medicines must be taken as prescribed.  Do not skip doses of blood pressure medicine. Doing this puts you at risk for problems and can make the medicine less effective.  Ask your health care provider about side effects or reactions to medicines that you  should watch for. Contact a health care provider if you:  Think you are having a reaction to a medicine you are taking.  Have headaches that keep coming back (recurring).  Feel dizzy.  Have swelling in your ankles.  Have trouble with your vision. Get help right away if you:  Develop a severe headache or confusion.  Have unusual weakness or numbness.  Feel faint.  Have severe pain in your chest or abdomen.  Vomit repeatedly.  Have trouble breathing. Summary  Hypertension is when the force of blood pumping through your arteries is too strong. If this condition is not controlled, it may put you at risk for serious complications.  Your personal target blood pressure may vary depending on your medical conditions, your age, and other factors. For most people, a normal blood pressure is less than 120/80.  Hypertension is  treated with lifestyle changes, medicines, or a combination of both. Lifestyle changes include losing weight, eating a healthy, low-sodium diet, exercising more, and limiting alcohol. This information is not intended to replace advice given to you by your health care provider. Make sure you discuss any questions you have with your health care provider. Document Revised: 06/09/2018 Document Reviewed: 06/09/2018 Elsevier Patient Education  [place].  Influenza (Flu) Vaccine (Inactivated or Recombinant): What You Need to Know 1. Why get vaccinated? Influenza vaccine can prevent influenza (flu). Flu is a contagious disease that spreads around the [place] every year, usually between October and May. Anyone can get the flu, but it is more dangerous for some people. Infants and young children, people 60 years of age and older, pregnant women, and people with certain health conditions or a weakened immune system are at greatest risk of flu complications. Pneumonia, bronchitis, sinus infections and ear infections are examples of flu-related complications. If you have a medical condition, such as heart disease, cancer or diabetes, flu can make it worse. Flu can cause fever and chills, sore throat, muscle aches, fatigue, cough, headache, and runny or stuffy nose. Some people may have vomiting and diarrhea, though this is more common in children than adults. Each year thousands of people in the [place] die from flu, and many more are hospitalized. Flu vaccine prevents millions of illnesses and flu-related visits to the doctor each year. 2. Influenza vaccine CDC recommends everyone 58 months of age and older get vaccinated every flu season. Children 6 months through 24 years of age may need 2 doses during a single flu season. Everyone else needs only 1 dose each flu season. It takes about 2 weeks for protection to develop after vaccination. There are many flu viruses, and they are always  changing. Each year a new flu vaccine is made to protect against three or four viruses that are likely to cause disease in the upcoming flu season. Even when the vaccine doesn't exactly match these viruses, it may still provide some protection. Influenza vaccine does not cause flu. Influenza vaccine may be given at the same time as other vaccines. 3. Talk with your health care provider Tell your vaccine provider if the person getting the vaccine:  Has had an allergic reaction after a previous dose of influenza vaccine, or has any severe, life-threatening allergies.  Has ever had Guillain-Barr Syndrome (also called GBS). In some cases, your health care provider may decide to postpone influenza vaccination to a future visit. People with minor illnesses, such as a cold, may be vaccinated. People who are moderately or severely ill should usually  wait until they recover before getting influenza vaccine. Your health care provider can give you more information. 4. Risks of a vaccine reaction  Soreness, redness, and swelling where shot is given, fever, muscle aches, and headache can happen after influenza vaccine.  There may be a very small increased risk of Guillain-Barr Syndrome (GBS) after inactivated influenza vaccine (the flu shot). Young children who get the flu shot along with pneumococcal vaccine (PCV13), and/or DTaP vaccine at the same time might be slightly more likely to have a seizure caused by fever. Tell your health care provider if a child who is getting flu vaccine has ever had a seizure. People sometimes faint after medical procedures, including vaccination. Tell your provider if you feel dizzy or have vision changes or ringing in the ears. As with any medicine, there is a very remote chance of a vaccine causing a severe allergic reaction, other serious injury, or death. 5. What if there is a serious problem? An allergic reaction could occur after the vaccinated person leaves the  clinic. If you see signs of a severe allergic reaction (hives, swelling of the face and throat, difficulty breathing, a fast heartbeat, dizziness, or weakness), call 9-1-1 and get the person to the nearest hospital. For other signs that concern you, call your health care provider. Adverse reactions should be reported to the Vaccine Adverse Event Reporting System (VAERS). Your health care provider will usually file this report, or you can do it yourself. Visit the VAERS website at www.vaers.SamedayNews.es or call 218-719-9588.VAERS is only for reporting reactions, and VAERS staff do not give medical advice. 6. The National Vaccine Injury Compensation Program The [employer] Vaccine Injury Compensation Program (VICP) is a federal program that was created to compensate people who may have been injured by certain vaccines. Visit the VICP website at GoldCloset.com.ee or call (403)005-3811 to learn about the program and about filing a claim. There is a time limit to file a claim for compensation. 7. How can I learn more?  Ask your healthcare provider.  Call your local or state health department.  Contact the Centers for Disease Control and Prevention (CDC): ? Call 475-257-5478 (1-800-CDC-INFO) or ? Visit CDC's https://gibson.com/ Vaccine Information Statement (Interim) Inactivated Influenza Vaccine (05/27/2018) This information is not intended to replace advice given to you by your health care provider. Make sure you discuss any questions you have with your health care provider. Document Revised: 01/18/2019 Document Reviewed: 05/31/2018 Elsevier Patient Education  [place]. Pneumococcal Polysaccharide Vaccine (PPSV23): What You Need to Know 1. Why get vaccinated? Pneumococcal polysaccharide vaccine (PPSV23) can prevent pneumococcal disease. Pneumococcal disease refers to any illness caused by pneumococcal bacteria. These bacteria can cause many types of illnesses, including pneumonia,  which is an infection of the lungs. Pneumococcal bacteria are one of the most common causes of pneumonia. Besides pneumonia, pneumococcal bacteria can also cause:  Ear infections  Sinus infections  Meningitis (infection of the tissue covering the brain and spinal cord)  Bacteremia (bloodstream infection) Anyone can get pneumococcal disease, but children under 11 years of age, people with certain medical conditions, adults 73 years or older, and cigarette smokers are at the highest risk. Most pneumococcal infections are mild. However, some can result in long-term problems, such as brain damage or hearing loss. Meningitis, bacteremia, and pneumonia caused by pneumococcal disease can be fatal. 2. PPSV23 PPSV23 protects against 23 types of bacteria that cause pneumococcal disease. PPSV23 is recommended for:  All adults 56 years or older,  Anyone 2  years or older with certain medical conditions that can lead to an increased risk for pneumococcal disease. Most people need only one dose of PPSV23. A second dose of PPSV23, and another type of pneumococcal vaccine called PCV13, are recommended for certain high-risk groups. Your health care provider can give you more information. People 65 years or older should get a dose of PPSV23 even if they have already gotten one or more doses of the vaccine before they turned 76. 3. Talk with your health care provider Tell your vaccine provider if the person getting the vaccine:  Has had an allergic reaction after a previous dose of PPSV23, or has any severe, life-threatening allergies. In some cases, your health care provider may decide to postpone PPSV23 vaccination to a future visit. People with minor illnesses, such as a cold, may be vaccinated. People who are moderately or severely ill should usually wait until they recover before getting PPSV23. Your health care provider can give you more information. 4. Risks of a vaccine reaction  Redness or pain  where the shot is given, feeling tired, fever, or muscle aches can happen after PPSV23. People sometimes faint after medical procedures, including vaccination. Tell your provider if you feel dizzy or have vision changes or ringing in the ears. As with any medicine, there is a very remote chance of a vaccine causing a severe allergic reaction, other serious injury, or death. 5. What if there is a serious problem? An allergic reaction could occur after the vaccinated person leaves the clinic. If you see signs of a severe allergic reaction (hives, swelling of the face and throat, difficulty breathing, a fast heartbeat, dizziness, or weakness), call 9-1-1 and get the person to the nearest hospital. For other signs that concern you, call your health care provider. Adverse reactions should be reported to the Vaccine Adverse Event Reporting System (VAERS). Your health care provider will usually file this report, or you can do it yourself. Visit the VAERS website at www.vaers.SamedayNews.es or call 323-144-9967. VAERS is only for reporting reactions, and VAERS staff do not give medical advice. 6. How can I learn more?  Ask your health care provider.  Call your local or state health department.  Contact the Centers for Disease Control and Prevention (CDC): ? Call 530-580-9208 (1-800-CDC-INFO) or ? Visit CDC's website at http://hunter.com/ CDC Vaccine Information Statement PPSV23 Vaccine (08/11/2018) This information is not intended to replace advice given to you by your health care provider. Make sure you discuss any questions you have with your health care provider. Document Revised: 01/18/2019 Document Reviewed: 05/11/2018 Elsevier Patient Education  [place].

## 2020-07-17 NOTE — Assessment & Plan Note (Signed)
Recommended eating smaller high protein, low fat meals more frequently and exercising 30 mins a day 5 times a week with a goal of 10-15lb weight loss in the next 3 months. Patient voiced their understanding and motivation to adhere to these recommendations.  

## 2020-07-17 NOTE — Assessment & Plan Note (Signed)
Chronic, ongoing.  Continue daily supplement and adjust as needed.  DEXA ordered and Vitamin D level.

## 2020-07-17 NOTE — Assessment & Plan Note (Signed)
Chronic, ongoing.  Continue current medication regimen and adjust as needed. Lipid panel today. 

## 2020-07-17 NOTE — Progress Notes (Signed)
BP 130/84   Pulse 66   Temp 98.6 F (37 C) (Oral)   Ht 5\' 4"  (1.626 m)   Wt 178 lb (80.7 kg)   SpO2 97%   BMI 30.55 kg/m    Subjective:    Patient ID: Brenda Wagner, female    DOB: June 05, 1956, 64 y.o.   MRN: [id number]  HPI: Brenda Wagner is a 64 y.o. female  Chief Complaint  Patient presents with  . Hypertension  . Hyperlipidemia  . Follow-up   HYPERTENSION / HYPERLIPIDEMIA Continues on Losartan and Atorvastatin.  BP recently elevated due to Ibuprofen use -- had been painting and nagged knee a little.    Vitamin D taken every day for history deficiency. Satisfied with current treatment? yes Duration of hypertension: chronic BP monitoring frequency: daily BP range: 130/70 range at home BP medication side effects: no Duration of hyperlipidemia: chronic Cholesterol medication side effects: no Cholesterol supplements: none Medication compliance: good compliance Aspirin: no Recent stressors: no Recurrent headaches: no Visual changes: no Palpitations: no Dyspnea: no Chest pain: no Lower extremity edema: no Dizzy/lightheaded: no  The 10-year ASCVD risk score Mikey Bussing DC Jr., et al., 2013) is: 7.7%   Values used to calculate the score:     Age: 14 years     Sex: Female     Is Non-Hispanic African American: No     Diabetic: No     Tobacco smoker: No     Systolic Blood Pressure: 323 mmHg     Is BP treated: Yes     HDL Cholesterol: 49 mg/dL     Total Cholesterol: 182 mg/dL   MIGRAINES Has one every two years -- this is much improved from her previous when younger.  None present today.  Has had two this past year.   Duration: chronic Headache status at time of visit: asymptomatic Treatments attempted: Treatments attempted: triptans   Aura: yes Nausea:  yes Vomiting: yes Photophobia:  yes Phonophobia:  no Effect on social functioning:  yes Numbers of missed days of school/work each month: none Confusion:  no Gait disturbance/ataxia:  no Behavioral  changes:  no Fevers:  no  Relevant past medical, surgical, family and social history reviewed and updated as indicated. Interim medical history since our last visit reviewed. Allergies and medications reviewed and updated.  Review of Systems  Constitutional: Negative for activity change, appetite change, diaphoresis, fatigue and fever.  Respiratory: Negative for cough, chest tightness and shortness of breath.   Cardiovascular: Negative for chest pain, palpitations and leg swelling.  Gastrointestinal: Negative.   Neurological: Negative.   Psychiatric/Behavioral: Negative.     Per HPI unless specifically indicated above     Objective:    BP 130/84   Pulse 66   Temp 98.6 F (37 C) (Oral)   Ht 5\' 4"  (1.626 m)   Wt 178 lb (80.7 kg)   SpO2 97%   BMI 30.55 kg/m   Wt Readings from Last 3 Encounters:  07/17/20 178 lb (80.7 kg)  01/04/20 177 lb (80.3 kg)  09/28/17 176 lb (79.8 kg)    Physical Exam Vitals and nursing note reviewed.  Constitutional:      General: She is awake. She is not in acute distress.    Appearance: She is well-developed, well-groomed and overweight. She is not ill-appearing.  HENT:     Head: Normocephalic.     Right Ear: Hearing normal.     Left Ear: Hearing normal.  Eyes:     General:  Lids are normal.        Right eye: No discharge.        Left eye: No discharge.     Conjunctiva/sclera: Conjunctivae normal.     Pupils: Pupils are equal, round, and reactive to light.  Neck:     Thyroid: No thyromegaly.     Vascular: No carotid bruit.  Cardiovascular:     Rate and Rhythm: Normal rate and regular rhythm.     Heart sounds: Normal heart sounds. No murmur. No gallop.   Pulmonary:     Effort: Pulmonary effort is normal. No accessory muscle usage or respiratory distress.     Breath sounds: Normal breath sounds.  Abdominal:     General: Bowel sounds are normal.     Palpations: Abdomen is soft.  Musculoskeletal:     Cervical back: Normal range of motion  and neck supple.     Right lower leg: No edema.     Left lower leg: No edema.  Skin:    General: Skin is warm and dry.  Neurological:     Mental Status: She is alert and oriented to person, place, and time.  Psychiatric:        Attention and Perception: Attention normal.        Mood and Affect: Mood normal.        Speech: Speech normal.        Behavior: Behavior normal. Behavior is cooperative.        Judgment: Judgment normal.    Results for orders placed or performed in visit on 08/08/24  Basic metabolic panel  Result Value Ref Range   Glucose 111 (H) 65 - 99 mg/dL   BUN 16 8 - 27 mg/dL   Creatinine, Ser 0.81 0.57 - 1.00 mg/dL   GFR calc non Af Amer 76 >59 mL/min/1.73   GFR calc Af Amer 88 >59 mL/min/1.73   BUN/Creatinine Ratio 20 12 - 28   Sodium 138 134 - 144 mmol/L   Potassium 4.8 3.5 - 5.2 mmol/L   Chloride 100 96 - 106 mmol/L   CO2 26 20 - 29 mmol/L   Calcium 9.8 8.7 - 10.3 mg/dL  Lipid Panel w/o Chol/HDL Ratio  Result Value Ref Range   Cholesterol, Total 182 100 - 199 mg/dL   Triglycerides 188 (H) 0 - 149 mg/dL   HDL 49 >39 mg/dL   VLDL Cholesterol Cal 32 5 - 40 mg/dL   LDL Chol Calc (NIH) 101 (H) 0 - 99 mg/dL      Assessment & Plan:   Problem List Items Addressed This Visit      Cardiovascular and Mediastinum   Essential hypertension, benign - Primary    Chronic, stable with BP at goal in office on repeat and at home.  Continue current medication regimen and adjust as needed.  Recommend she continue to monitor BP at home regularly + focus on DASH diet.  Minimize Ibuprofen intake. BMP and TSH today.  Return in 6 months.      Relevant Orders   Basic metabolic panel   TSH   Migraine headache with aura    Chronic, stable, minimal migraines for which Imitrex offers benefit.  Refills sent as needed and consider adjustment of regimen in future.        Other   Hyperlipemia    Chronic, ongoing.  Continue current medication regimen and adjust as needed. Lipid  panel today.      Relevant Orders   Lipid Panel w/o  Chol/HDL Ratio   Vitamin D deficiency    Chronic, ongoing.  Continue daily supplement and adjust as needed.  DEXA ordered and Vitamin D level.      Relevant Orders   VITAMIN D 25 Hydroxy (Vit-D Deficiency, Fractures)   Obesity    Recommended eating smaller high protein, low fat meals more frequently and exercising 30 mins a day 5 times a week with a goal of 10-15lb weight loss in the next 3 months. Patient voiced their understanding and motivation to adhere to these recommendations.        Other Visit Diagnoses    Need for influenza vaccination       Flu vaccine today   Relevant Orders   Flu Vaccine QUAD High Dose(Fluad) (Completed)   Need for Streptococcus pneumoniae vaccination       Pneumonia vaccine today   Relevant Orders   Pneumococcal polysaccharide vaccine 23-valent greater than or equal to 2yo subcutaneous/IM   Postmenopausal estrogen deficiency       DEXA scan ordered and discussed   Relevant Orders   DG Bone Density       Follow up plan: Return in about 6 months (around 01/15/2021) for HTN/HLD, Vit D, Migraines.

## 2020-07-17 NOTE — Assessment & Plan Note (Addendum)
Chronic, stable with BP at goal in office on repeat and at home.  Continue current medication regimen and adjust as needed.  Recommend she continue to monitor BP at home regularly + focus on DASH diet.  Minimize Ibuprofen intake. BMP and TSH today.  Return in 6 months.

## 2020-07-17 NOTE — Assessment & Plan Note (Signed)
Chronic, stable, minimal migraines for which Imitrex offers benefit.  Refills sent as needed and consider adjustment of regimen in future. 

## 2020-07-18 LAB — BASIC METABOLIC PANEL
BUN/Creatinine Ratio: 20 (ref 12–28)
BUN: 14 mg/dL (ref 8–27)
CO2: 23 mmol/L (ref 20–29)
Calcium: 9.4 mg/dL (ref 8.7–10.3)
Chloride: 103 mmol/L (ref 96–106)
Creatinine, Ser: 0.71 mg/dL (ref 0.57–1.00)
GFR calc Af Amer: 103 mL/min/{1.73_m2} (ref >59)
GFR calc non Af Amer: 90 mL/min/{1.73_m2} (ref >59)
Glucose: 108 mg/dL — ABNORMAL HIGH (ref 65–99)
Potassium: 4.4 mmol/L (ref 3.5–5.2)
Sodium: 139 mmol/L (ref 134–144)

## 2020-07-18 LAB — LIPID PANEL W/O CHOL/HDL RATIO
Cholesterol, Total: 176 mg/dL (ref 100–199)
HDL: 47 mg/dL (ref >39)
LDL Chol Calc (NIH): 101 mg/dL — ABNORMAL HIGH (ref 0–99)
Triglycerides: 160 mg/dL — ABNORMAL HIGH (ref 0–149)
VLDL Cholesterol Cal: 28 mg/dL (ref 5–40)

## 2020-07-18 LAB — TSH: TSH: 1.99 u[IU]/mL (ref 0.450–4.500)

## 2020-07-18 LAB — VITAMIN D 25 HYDROXY (VIT D DEFICIENCY, FRACTURES): Vit D, 25-Hydroxy: 41.2 ng/mL (ref 30.0–100.0)

## 2020-07-18 NOTE — Progress Notes (Signed)
Contacted via [place] [facility] [place], your labs have returned.  Kidney function remains stable and glucose, sugar, is mildly elevated.  Monitor diet closely.  Thyroid testing is normal.  Vitamin D level is normal range.  Cholesterol levels show LDL above goal, you may benefit from increase in Atorvastatin at next visit to 40 MG to get tighter control of this.  Let me know if you would like to increase now or wait until next visit.  Any questions? Keep being awesome!!  Thank you for allowing me to participate in your care. Kindest regards, Jaken Fregia

## 2020-08-21 ENCOUNTER — Encounter: Payer: Self-pay | Admitting: Dermatology

## 2020-08-21 ENCOUNTER — Ambulatory Visit (INDEPENDENT_AMBULATORY_CARE_PROVIDER_SITE_OTHER): Payer: Medicare Other | Admitting: Dermatology

## 2020-08-21 ENCOUNTER — Other Ambulatory Visit: Payer: Self-pay

## 2020-08-21 DIAGNOSIS — L719 Rosacea, unspecified: Secondary | ICD-10-CM | POA: Diagnosis not present

## 2020-08-21 DIAGNOSIS — L820 Inflamed seborrheic keratosis: Secondary | ICD-10-CM | POA: Diagnosis not present

## 2020-08-21 DIAGNOSIS — Z85828 Personal history of other malignant neoplasm of skin: Secondary | ICD-10-CM

## 2020-08-21 DIAGNOSIS — L570 Actinic keratosis: Secondary | ICD-10-CM

## 2020-08-21 DIAGNOSIS — D225 Melanocytic nevi of trunk: Secondary | ICD-10-CM

## 2020-08-21 DIAGNOSIS — L578 Other skin changes due to chronic exposure to nonionizing radiation: Secondary | ICD-10-CM

## 2020-08-21 DIAGNOSIS — D229 Melanocytic nevi, unspecified: Secondary | ICD-10-CM

## 2020-08-21 DIAGNOSIS — Z86018 Personal history of other benign neoplasm: Secondary | ICD-10-CM

## 2020-08-21 DIAGNOSIS — Z1283 Encounter for screening for malignant neoplasm of skin: Secondary | ICD-10-CM

## 2020-08-21 DIAGNOSIS — Z85820 Personal history of malignant melanoma of skin: Secondary | ICD-10-CM

## 2020-08-21 DIAGNOSIS — L814 Other melanin hyperpigmentation: Secondary | ICD-10-CM

## 2020-08-21 DIAGNOSIS — L821 Other seborrheic keratosis: Secondary | ICD-10-CM

## 2020-08-21 NOTE — Patient Instructions (Addendum)
Rosacea  What is rosacea? Rosacea (say: ro-zay-sha) is a common skin disease that usually begins as a trend of flushing or blushing easily.  As rosacea progresses, a persistent redness in the center of the face will develop and may gradually spread beyond the nose and cheeks to the forehead and chin.  In some cases, the ears, chest, and back could be affected.  Rosacea may appear as tiny blood vessels or small red bumps that occur in crops.  Frequently they can contain pus, and are called "pustules".  If the bumps do not contain pus, they are referred to as "papules".  Rarely, in prolonged, untreated cases of rosacea, the oil glands of the nose and cheeks may become permanently enlarged.  This is called rhinophyma, and is seen more frequently in men.  Signs and Risks In its beginning stages, rosacea tends to come and go, which makes it difficult to recognize.  It can start as intermittent flushing of the face.  Eventually, blood vessels may become permanently visible.  Pustules and papules can appear, but can be mistaken for adult acne.  People of all races, ages, genders and ethnic groups are at risk of developing rosacea.  However, it is more common in women (especially around menopause) and adults with fair skin between the ages of 30 and 50.  Treatment Dermatologists typically recommend a combination of treatments to effectively manage rosacea.  Treatment can improve symptoms and may stop the progression of the rosacea.  Treatment may involve both topical and oral medications.  The tetracycline antibiotics are often used for their anti-inflammatory effect; however, because of the possibility of developing antibiotic resistance, they should not be used long term at full dose.  For dilated blood vessels the options include electrodessication (uses electric current through a small needle), laser treatment, and cosmetics to hide the redness.   With all forms of treatment, improvement is a slow process, and  patients may not see any results for the first 3-4 weeks.  It is very important to avoid the sun and other triggers.  Patients must wear sunscreen daily.  Skin Care Instructions: 1. Cleanse the skin with a mild soap such as CeraVe cleanser, Cetaphil cleanser, or Dove soap once or twice daily as needed. 2. Moisturize with Eucerin Redness Relief Daily Perfecting Lotion (has a subtle green tint), CeraVe Moisturizing Cream, or Oil of Olay Daily Moisturizer with sunscreen every morning and/or night as recommended. 3. Makeup should be "non-comedogenic" (won't clog pores) and be labeled "for sensitive skin". Good choices for cosmetics are: Neutrogena, Almay, and Physician's Formula.  Any product with a green tint tends to offset a red complexion. 4. If your eyes are dry and irritated, use artificial tears 2-3 times per day and cleanse the eyelids daily with baby shampoo.  Have your eyes examined at least every 2 years.  Be sure to tell your eye doctor that you have rosacea. 5. Alcoholic beverages tend to cause flushing of the skin, and may make rosacea worse. 6. Always wear sunscreen, protect your skin from extreme hot and cold temperatures, and avoid spicy foods, hot drinks, and mechanical irritation such as rubbing, scrubbing, or massaging the face.  Avoid harsh skin cleansers, cleansing masks, astringents, and exfoliation. If a particular product burns or makes your face feel tight, then it is likely to flare your rosacea. 7. If you are having difficulty finding a sunscreen that you can tolerate, you may try switching to a chemical-free sunscreen.  These are ones whose active   ingredient is zinc oxide or titanium dioxide only.  They should also be fragrance free, non-comedogenic, and labeled for sensitive skin. 8. Rosacea triggers may vary from person to person.  There are a variety of foods that have been reported to trigger rosacea.  Some patients find that keeping a diary of what they were doing when they  flared helps them avoid triggers.     Melanoma ABCDEs  Melanoma is the most dangerous type of skin cancer, and is the leading cause of death from skin disease.  You are more likely to develop melanoma if you:  Have light-colored skin, light-colored eyes, or red or blond hair  Spend a lot of time in the sun  Tan regularly, either outdoors or in a tanning bed  Have had blistering sunburns, especially during childhood  Have a close family member who has had a melanoma  Have atypical moles or large birthmarks  Early detection of melanoma is key since treatment is typically straightforward and cure rates are extremely high if we catch it early.   The first sign of melanoma is often a change in a mole or a new dark spot.  The ABCDE system is a way of remembering the signs of melanoma.  A for asymmetry:  The two halves do not match. B for border:  The edges of the growth are irregular. C for color:  A mixture of colors are present instead of an even brown color. D for diameter:  Melanomas are usually (but not always) greater than 65mm - the size of a pencil eraser. E for evolution:  The spot keeps changing in size, shape, and color.  Please check your skin once per month between visits. You can use a small mirror in front and a large mirror behind you to keep an eye on the back side or your body.   If you see any new or changing lesions before your next follow-up, please call to schedule a visit.  Please continue daily skin protection including broad spectrum sunscreen SPF 30+ to sun-exposed areas, reapplying every 2 hours as needed when you're outdoors.

## 2020-08-21 NOTE — Progress Notes (Signed)
Follow-Up Visit   Subjective  Brenda Wagner is a 64 y.o. female who presents for the following: Annual Exam (Total body skin exam, hx of Melanoma,BCC, Dysplastic nevus, AKs) and check growth (R frontal hairline, pt said was txted with LN2 in past and came back ~3m ago). Has scaly spots L temple area which get irritated.  Brown spot on R hand has darkened.   The following portions of the chart were reviewed this encounter and updated as appropriate:      Review of Systems:  No other skin or systemic complaints except as noted in HPI or Assessment and Plan.  Objective  Well appearing patient in no apparent distress; mood and affect are within normal limits.  A full examination was performed including scalp, head, eyes, ears, nose, lips, neck, chest, axillae, abdomen, back, buttocks, bilateral upper extremities, bilateral lower extremities, hands, feet, fingers, toes, fingernails, and toenails. All findings within normal limits unless otherwise noted below.  Objective  Right Upper Back: Well healed scar with no evidence of recurrence   Objective  R frontal scalp hairline x 1, R hand dorsum x 1 (5): Erythematous keratotic or waxy stuck-on papule, scalp Waxy brown macule with darker edge R hand dorsum  Objective  face: Erythema cheeks, nose with telangiectasias  Objective  R inf nasal tip x 1,  R temple x 3 (4): Pink/brown scaly macules/papules   Right Hip; Right buttock  Objective  Right buttock: 4.33mm speckled brown macule  Right Hip: 4.0 x 2.29mm speckled brown macule   Assessment & Plan    Actinic Damage - chronic, secondary to cumulative UV radiation exposure/sun exposure over time - diffuse scaly erythematous macules with underlying dyspigmentation - Recommend daily broad spectrum sunscreen SPF 30+ to sun-exposed areas, reapply every 2 hours as needed.  - Call for new or changing lesions. - - Severe, chronic - Discussed "Field Treatment" for Severe, Confluent  Actinic Changes with Pre-Cancerous Actinic Keratoses due to cumulative sun exposure/UV radiation exposure over time Field treatment involves treatment of an entire area of skin that has confluent Actinic Changes (Sun/ Ultraviolet light damage) and PreCancerous Actinic Keratoses by method of PhotoDynamic Therapy (PDT) and/or prescription Topical Chemotherapy agents such as 5-fluorouracil, 5-fluorouracil/calcipotriene, and/or imiquimod.  The purpose is to decrease the number of clinically evident and subclinical PreCancerous lesions to prevent progression to development of skin cancer by chemically destroying early precancer changes that may or may not be visible.  It has been shown to reduce the risk of developing skin cancer in the treated area. As a result of treatment, redness, scaling, crusting, and open sores may occur during treatment course. One or more than one of these methods may be used and may have to be used several times to control, suppress and eliminate the PreCancerous changes. Discussed treatment course, expected reaction, and possible side effects. -Patient defers at this time  History of Basal Cell Carcinoma of the Skin - No evidence of recurrence today R nasal tip, R upper chest - Recommend regular full body skin exams - Recommend daily broad spectrum sunscreen SPF 30+ to sun-exposed areas, reapply every 2 hours as needed.  - Call if any new or changing lesions are noted between office visits  History of Dysplastic Nevi - No evidence of recurrence today- R upper abdomen - Recommend regular full body skin exams - Recommend daily broad spectrum sunscreen SPF 30+ to sun-exposed areas, reapply every 2 hours as needed.  - Call if any new or changing lesions  are noted between office visits  Lentigines - Scattered tan macules - Discussed due to sun exposure - Benign, observe - Call for any changes  Seborrheic Keratoses - Stuck-on, waxy, tan-brown papules and plaques  - Discussed  benign etiology and prognosis. - Observe - Call for any changes  History of melanoma Right Upper Back  Breslow's 0.22mm, Clark's level IV Excised 11/27/2014  Clear. Observe for recurrence. Call clinic for new or changing lesions.  Recommend regular skin exams, daily broad-spectrum spf 30+ sunscreen use, and photoprotection.     Inflamed seborrheic keratosis (5) R frontal scalp hairline x 1, R hand dorsum x 1    Destruction of lesion - R frontal scalp hairline x 1, R hand dorsum x 1  Destruction method: cryotherapy   Informed consent: discussed and consent obtained   Lesion destroyed using liquid nitrogen: Yes   Region frozen until ice ball extended beyond lesion: Yes   Outcome: patient tolerated procedure well with no complications   Post-procedure details: wound care instructions given   Additional details:  Prior to procedure, discussed risks of blister formation, small wound, skin dyspigmentation, or rare scar following cryotherapy.     Rosacea face  Chronic, Mild No treatment  Recommend daily broad spectrum sunscreen SPF 30+ to sun-exposed areas, reapply every 2 hours as needed.   AK (actinic keratosis) (4) R inf nasal tip x 1,  R temple x 3  (vrs ISKs R temple)  Destruction of lesion - R inf nasal tip x 1,  R temple x 3  Destruction method: cryotherapy   Informed consent: discussed and consent obtained   Lesion destroyed using liquid nitrogen: Yes   Region frozen until ice ball extended beyond lesion: Yes   Outcome: patient tolerated procedure well with no complications   Post-procedure details: wound care instructions given    Nevus (2) Right Hip; Right buttock  Benign-appearing.  Stable. Observation.  Call clinic for new or changing moles.  Recommend daily use of broad spectrum spf 30+ sunscreen to sun-exposed areas.     Skin cancer screening performed today.  Return in about 1 year (around 08/21/2021) for TBSE, hx of Melanoma, BCC, Dysplastic Nevus,  AK.  I, Sonya Hupman, RMA, am acting as scribe for [name], MD . Documentation: I have reviewed the above documentation for accuracy and completeness, and I agree with the above.  [name] MD

## 2020-08-23 ENCOUNTER — Other Ambulatory Visit: Payer: Self-pay

## 2020-08-23 ENCOUNTER — Ambulatory Visit
Admission: RE | Admit: 2020-08-23 | Discharge: 2020-08-23 | Disposition: A | Discharge status: Home / Self Care | Payer: Medicare Other | Source: Ambulatory Visit | Attending: Nurse Practitioner | Admitting: Nurse Practitioner

## 2020-08-23 DIAGNOSIS — Z780 Asymptomatic menopausal state: Secondary | ICD-10-CM | POA: Diagnosis present

## 2020-08-23 NOTE — Progress Notes (Signed)
Contacted via [place]  Your bone density shows thinning bones (osteopenia) but not brittle (osteoporosis). We recommend Vitamin D supplementation of about 2,0000 IUs of over the counter Vitamin D3. In addition, we recommend a diet high in calcium with dairy and dark green leafy vegetables. We would like you to get plenty of weight bearing exercises with walking and resistance training such as light weights or resistance bands available with instructions at places such as [employer]. Any questions? Keep being awesome!!  Thank you for allowing me to participate in your care. Kindest regards, Afra Tricarico

## 2020-12-03 ENCOUNTER — Telehealth: Payer: Self-pay | Admitting: Internal Medicine

## 2020-12-03 NOTE — Telephone Encounter (Signed)
Routing to provider  

## 2020-12-03 NOTE — Telephone Encounter (Signed)
No cream called " zostrix' pl let pt know to address at next visit. Hasnt been seen by me yet.

## 2020-12-03 NOTE — Telephone Encounter (Signed)
Pt sees an orthopedist at [facility] clinic but he advised Pt he doesn't prescribe the cream she is wanting and he advised her to ask her pcp for a prescription for Zostrix for the arthritis behind her knee cap / please advise or send to  [id number] - [place], [place] - [address] Phone:  [phone]  Fax:  [phone]

## 2020-12-03 NOTE — Telephone Encounter (Signed)
Patient notified

## 2021-01-02 ENCOUNTER — Other Ambulatory Visit: Payer: Self-pay | Admitting: Nurse Practitioner

## 2021-01-02 DIAGNOSIS — Z1231 Encounter for screening mammogram for malignant neoplasm of breast: Secondary | ICD-10-CM

## 2021-01-11 ENCOUNTER — Encounter: Payer: Self-pay | Admitting: Nurse Practitioner

## 2021-01-11 DIAGNOSIS — M8580 Other specified disorders of bone density and structure, unspecified site: Secondary | ICD-10-CM | POA: Insufficient documentation

## 2021-01-15 ENCOUNTER — Other Ambulatory Visit: Payer: Self-pay

## 2021-01-15 ENCOUNTER — Encounter: Payer: Self-pay | Admitting: Nurse Practitioner

## 2021-01-15 ENCOUNTER — Ambulatory Visit (INDEPENDENT_AMBULATORY_CARE_PROVIDER_SITE_OTHER): Payer: Medicare Other | Admitting: Nurse Practitioner

## 2021-01-15 VITALS — BP 132/78 | HR 73 | Temp 98.4°F | Wt 174.8 lb

## 2021-01-15 DIAGNOSIS — Z6830 Body mass index (BMI) 30.0-30.9, adult: Secondary | ICD-10-CM

## 2021-01-15 DIAGNOSIS — M8588 Other specified disorders of bone density and structure, other site: Secondary | ICD-10-CM

## 2021-01-15 DIAGNOSIS — E559 Vitamin D deficiency, unspecified: Secondary | ICD-10-CM

## 2021-01-15 DIAGNOSIS — I10 Essential (primary) hypertension: Secondary | ICD-10-CM | POA: Diagnosis not present

## 2021-01-15 DIAGNOSIS — E6609 Other obesity due to excess calories: Secondary | ICD-10-CM

## 2021-01-15 DIAGNOSIS — E7849 Other hyperlipidemia: Secondary | ICD-10-CM | POA: Diagnosis not present

## 2021-01-15 DIAGNOSIS — G43109 Migraine with aura, not intractable, without status migrainosus: Secondary | ICD-10-CM | POA: Diagnosis not present

## 2021-01-15 MED ORDER — SUMATRIPTAN SUCCINATE 100 MG PO TABS: 100.0000 mg | ORAL_TABLET | ORAL | 5 refills | Status: AC | PRN

## 2021-01-15 MED ORDER — LOSARTAN POTASSIUM 100 MG PO TABS: 1.0000 | ORAL_TABLET | Freq: Every day | ORAL | 4 refills | Status: AC

## 2021-01-15 MED ORDER — FLUTICASONE PROPIONATE 50 MCG/ACT NA SUSP: NASAL | 6 refills | Status: AC

## 2021-01-15 NOTE — Patient Instructions (Signed)

## 2021-01-15 NOTE — Assessment & Plan Note (Signed)
Chronic, stable, minimal migraines for which Imitrex offers benefit.  Refills sent as needed and consider adjustment of regimen in future.

## 2021-01-15 NOTE — Assessment & Plan Note (Signed)
Chronic, ongoing.  Continue daily supplement and adjust as needed.  Vitamin D level today.

## 2021-01-15 NOTE — Assessment & Plan Note (Signed)
Noted on recent DEXA, check Vitamin D level today and continue daily supplement + addition of adequate calcium daily.  Plan on repeat DEXA in 5 years.  Discussed with patient.

## 2021-01-15 NOTE — Assessment & Plan Note (Signed)
Chronic, stable with BP at goal in office on repeat and at home.  Continue current medication regimen and adjust as needed.  Recommend she continue to monitor BP at home regularly + focus on DASH diet.  Minimize Ibuprofen intake. CMP and lipid check today.  Refills sent in.  Return in 6 months.

## 2021-01-15 NOTE — Progress Notes (Signed)
BP 132/78 (BP Location: Left Arm)   Pulse 73   Temp 98.4 F (36.9 C) (Oral)   Wt 174 lb 12.8 oz (79.3 kg)   SpO2 97%   BMI 30.00 kg/m    Subjective:    Patient ID: Brenda Wagner, female    DOB: 08-07-1956, 65 y.o.   MRN: [id number]  HPI: Brenda Wagner is a 65 y.o. female  Chief Complaint  Patient presents with  . 6 month follow up     Patient states she needs refills on medication.    HYPERTENSION / HYPERLIPIDEMIA Continues on Losartan and Atorvastatin.  She is currently being followed by [facility] orth[facility] for ongoing knee pain -- mild arthritis present. She stopped taking her Atorvastatin one month ago, pain is improved some.  Had injections to both knees 12/06/20 -- pain is improving.    She has given up sodas, sugars have been mildly elevated on labs. Satisfied with current treatment? yes Duration of hypertension: chronic BP monitoring frequency: a few days a week BP range: 130/70 range at home BP medication side effects: no Duration of hyperlipidemia: chronic Cholesterol medication side effects: no Cholesterol supplements: none Medication compliance: good compliance Aspirin: no Recent stressors: no Recurrent headaches: no Visual changes: no Palpitations: no Dyspnea: no Chest pain: no Lower extremity edema: no Dizzy/lightheaded: no  The 10-year ASCVD risk score Mikey Bussing DC Jr., et al., 2013) is: 8.8%   Values used to calculate the score:     Age: 78 years     Sex: Female     Is Non-Hispanic African American: No     Diabetic: No     Tobacco smoker: No     Systolic Blood Pressure: 948 mmHg     Is BP treated: Yes     HDL Cholesterol: 47 mg/dL     Total Cholesterol: 176 mg/dL   MIGRAINES Has one every two years -- this is much improved from her previous when younger.  None present today.  Has had a couple over past weeks.   Duration: chronic Headache status at time of visit: asymptomatic Treatments attempted: Treatments attempted: triptans   Aura: yes Nausea:   yes Vomiting: yes Photophobia:  yes Phonophobia:  no Effect on social functioning:  yes Numbers of missed days of school/work each month: none Confusion:  no Gait disturbance/ataxia:  no Behavioral changes:  no Fevers:  no  OSTEOPENIA Had DEXA on 08/23/20 and noted osteopenia -- The BMD measured at AP Spine L1-L4 (L3) is 1.014 g/cm2 with a T-score of -1.4.  Last Vitamin D level 41.2. Satisfied with current treatment?: yes Adequate calcium & vitamin D: yes Intolerance to bisphosphonates: not taken yet Weight bearing exercises: yes  Relevant past medical, surgical, family and social history reviewed and updated as indicated. Interim medical history since our last visit reviewed. Allergies and medications reviewed and updated.  Review of Systems  Constitutional: Negative for activity change, appetite change, diaphoresis, fatigue and fever.  Respiratory: Negative for cough, chest tightness and shortness of breath.   Cardiovascular: Negative for chest pain, palpitations and leg swelling.  Gastrointestinal: Negative.   Neurological: Negative.   Psychiatric/Behavioral: Negative.     Per HPI unless specifically indicated above     Objective:    BP 132/78 (BP Location: Left Arm)   Pulse 73   Temp 98.4 F (36.9 C) (Oral)   Wt 174 lb 12.8 oz (79.3 kg)   SpO2 97%   BMI 30.00 kg/m   Wt Readings from Last  3 Encounters:  01/15/21 174 lb 12.8 oz (79.3 kg)  07/17/20 178 lb (80.7 kg)  01/04/20 177 lb (80.3 kg)    Physical Exam Vitals and nursing note reviewed.  Constitutional:      General: She is awake. She is not in acute distress.    Appearance: She is well-developed, well-groomed and overweight. She is not ill-appearing.  HENT:     Head: Normocephalic.     Right Ear: Hearing normal.     Left Ear: Hearing normal.  Eyes:     General: Lids are normal.        Right eye: No discharge.        Left eye: No discharge.     Conjunctiva/sclera: Conjunctivae normal.     Pupils:  Pupils are equal, round, and reactive to light.  Neck:     Thyroid: No thyromegaly.     Vascular: No carotid bruit.  Cardiovascular:     Rate and Rhythm: Normal rate and regular rhythm.     Heart sounds: Normal heart sounds. No murmur heard. No gallop.   Pulmonary:     Effort: Pulmonary effort is normal. No accessory muscle usage or respiratory distress.     Breath sounds: Normal breath sounds.  Abdominal:     General: Bowel sounds are normal.     Palpations: Abdomen is soft.  Musculoskeletal:     Cervical back: Normal range of motion and neck supple.     Right lower leg: No edema.     Left lower leg: No edema.  Skin:    General: Skin is warm and dry.  Neurological:     Mental Status: She is alert and oriented to person, place, and time.  Psychiatric:        Attention and Perception: Attention normal.        Mood and Affect: Mood normal.        Speech: Speech normal.        Behavior: Behavior normal. Behavior is cooperative.        Judgment: Judgment normal.    Results for orders placed or performed in visit on 89/38/10  Basic metabolic panel  Result Value Ref Range   Glucose 108 (H) 65 - 99 mg/dL   BUN 14 8 - 27 mg/dL   Creatinine, Ser 0.71 0.57 - 1.00 mg/dL   GFR calc non Af Amer 90 >59 mL/min/1.73   GFR calc Af Amer 103 >59 mL/min/1.73   BUN/Creatinine Ratio 20 12 - 28   Sodium 139 134 - 144 mmol/L   Potassium 4.4 3.5 - 5.2 mmol/L   Chloride 103 96 - 106 mmol/L   CO2 23 20 - 29 mmol/L   Calcium 9.4 8.7 - 10.3 mg/dL  TSH  Result Value Ref Range   TSH 1.990 0.450 - 4.500 uIU/mL  Lipid Panel w/o Chol/HDL Ratio  Result Value Ref Range   Cholesterol, Total 176 100 - 199 mg/dL   Triglycerides 160 (H) 0 - 149 mg/dL   HDL 47 >39 mg/dL   VLDL Cholesterol Cal 28 5 - 40 mg/dL   LDL Chol Calc (NIH) 101 (H) 0 - 99 mg/dL  VITAMIN D 25 Hydroxy (Vit-D Deficiency, Fractures)  Result Value Ref Range   Vit D, 25-Hydroxy 41.2 30.0 - 100.0 ng/mL      Assessment & Plan:    Problem List Items Addressed This Visit      Cardiovascular and Mediastinum   Essential hypertension, benign - Primary    Chronic, stable  with BP at goal in office on repeat and at home.  Continue current medication regimen and adjust as needed.  Recommend she continue to monitor BP at home regularly + focus on DASH diet.  Minimize Ibuprofen intake. CMP and lipid check today.  Refills sent in.  Return in 6 months.      Relevant Medications   losartan (COZAAR) 100 MG tablet   Other Relevant Orders   Comprehensive metabolic panel   Migraine headache with aura    Chronic, stable, minimal migraines for which Imitrex offers benefit.  Refills sent as needed and consider adjustment of regimen in future.      Relevant Medications   losartan (COZAAR) 100 MG tablet   SUMAtriptan (IMITREX) 100 MG tablet     Musculoskeletal and Integument   Osteopenia    Noted on recent DEXA, check Vitamin D level today and continue daily supplement + addition of adequate calcium daily.  Plan on repeat DEXA in 5 years.  Discussed with patient.      Relevant Orders   VITAMIN D 25 Hydroxy (Vit-D Deficiency, Fractures)     Other   Hyperlipemia    Chronic, ongoing.  She stopped taking Atorvastatin due to knee pain, which has helped some. Lipid panel today.  Discussed with her if elevations could consider trial of Rosuvastatin 10 MG three times a week, discussed with patient.      Relevant Medications   losartan (COZAAR) 100 MG tablet   Other Relevant Orders   Lipid Panel w/o Chol/HDL Ratio   Vitamin D deficiency    Chronic, ongoing.  Continue daily supplement and adjust as needed.  Vitamin D level today.      Obesity    BMI 30, has lost some weight.  Recommended eating smaller high protein, low fat meals more frequently and exercising 30 mins a day 5 times a week with a goal of 10-15lb weight loss in the next 3 months. Patient voiced their understanding and motivation to adhere to these  recommendations.           Follow up plan: Return in about 6 months (around 07/17/2021) for HTN/HLD, OSTEOPENIA, MIGRAINES.

## 2021-01-15 NOTE — Assessment & Plan Note (Signed)
BMI 30, has lost some weight.  Recommended eating smaller high protein, low fat meals more frequently and exercising 30 mins a day 5 times a week with a goal of 10-15lb weight loss in the next 3 months. Patient voiced their understanding and motivation to adhere to these recommendations.

## 2021-01-15 NOTE — Assessment & Plan Note (Signed)
Chronic, ongoing.  She stopped taking Atorvastatin due to knee pain, which has helped some. Lipid panel today.  Discussed with her if elevations could consider trial of Rosuvastatin 10 MG three times a week, discussed with patient.

## 2021-01-16 ENCOUNTER — Telehealth: Payer: Self-pay | Admitting: *Deleted

## 2021-01-16 LAB — COMPREHENSIVE METABOLIC PANEL
ALT: 20 IU/L (ref 0–32)
AST: 19 IU/L (ref 0–40)
Albumin/Globulin Ratio: 2 (ref 1.2–2.2)
Albumin: 4.5 g/dL (ref 3.8–4.8)
Alkaline Phosphatase: 83 IU/L (ref 44–121)
BUN/Creatinine Ratio: 19 (ref 12–28)
BUN: 13 mg/dL (ref 8–27)
Bilirubin Total: 0.4 mg/dL (ref 0.0–1.2)
CO2: 24 mmol/L (ref 20–29)
Calcium: 9.4 mg/dL (ref 8.7–10.3)
Chloride: 102 mmol/L (ref 96–106)
Creatinine, Ser: 0.7 mg/dL (ref 0.57–1.00)
Globulin, Total: 2.3 g/dL (ref 1.5–4.5)
Glucose: 104 mg/dL — ABNORMAL HIGH (ref 65–99)
Potassium: 4.5 mmol/L (ref 3.5–5.2)
Sodium: 142 mmol/L (ref 134–144)
Total Protein: 6.8 g/dL (ref 6.0–8.5)
eGFR: 95 mL/min/{1.73_m2} (ref >59)

## 2021-01-16 LAB — LIPID PANEL W/O CHOL/HDL RATIO
Cholesterol, Total: 300 mg/dL — ABNORMAL HIGH (ref 100–199)
HDL: 47 mg/dL (ref >39)
LDL Chol Calc (NIH): 216 mg/dL — ABNORMAL HIGH (ref 0–99)
Triglycerides: 193 mg/dL — ABNORMAL HIGH (ref 0–149)
VLDL Cholesterol Cal: 37 mg/dL (ref 5–40)

## 2021-01-16 LAB — VITAMIN D 25 HYDROXY (VIT D DEFICIENCY, FRACTURES): Vit D, 25-Hydroxy: 37 ng/mL (ref 30.0–100.0)

## 2021-01-16 NOTE — Progress Notes (Signed)
Contacted via [facility] [place], your labs have returned.  Kidney and liver function remain stable.  Vitamin D level normal.  Cholesterol levels have trended up without medication on board showing LDL now 216, was 101, and total cholesterol 300 -- was 176.  I would recommend trying Rosuvastatin 10 MG on a 3 day a week schedule, this may cause less side effects then the Atorvastatin.  We would try it on 3 day a week and if discomfort try 1 day a week with addition of Zetia to help lower levels.  If we start this would like you to come in 6 weeks for visit to see how you are tolerating and recheck labs.  Thoughts?  Let me know. Keep being awesome!!  Thank you for allowing me to participate in your care. Kindest regards, Katiria Calame

## 2021-01-16 NOTE — Telephone Encounter (Signed)
PT BROUGHT IN PLACARD TO BE SIGNED. OK FOR HER HUSBAND TO PICK IT UP TOMORROW AT HIS APPT. @ 8:40 PLACED IN BIN FOR REVIEW

## 2021-01-16 NOTE — Telephone Encounter (Signed)
In your folder for signature

## 2021-01-18 NOTE — Telephone Encounter (Signed)
Patient wants you to sign a handicap placard application for [facility]

## 2021-01-28 ENCOUNTER — Ambulatory Visit
Admission: RE | Admit: 2021-01-28 | Discharge: 2021-01-28 | Disposition: A | Discharge status: Home / Self Care | Payer: Medicare Other | Source: Ambulatory Visit | Attending: Nurse Practitioner | Admitting: Nurse Practitioner

## 2021-01-28 ENCOUNTER — Other Ambulatory Visit: Payer: Self-pay | Admitting: Nurse Practitioner

## 2021-01-28 ENCOUNTER — Encounter: Payer: Self-pay | Admitting: Nurse Practitioner

## 2021-01-28 ENCOUNTER — Telehealth: Payer: Self-pay | Admitting: *Deleted

## 2021-01-28 ENCOUNTER — Other Ambulatory Visit: Payer: Self-pay

## 2021-01-28 DIAGNOSIS — Z1231 Encounter for screening mammogram for malignant neoplasm of breast: Secondary | ICD-10-CM | POA: Diagnosis present

## 2021-01-28 DIAGNOSIS — T466X5A Adverse effect of antihyperlipidemic and antiarteriosclerotic drugs, initial encounter: Secondary | ICD-10-CM | POA: Insufficient documentation

## 2021-01-28 DIAGNOSIS — M791 Myalgia, unspecified site: Secondary | ICD-10-CM | POA: Insufficient documentation

## 2021-01-28 MED ORDER — ROSUVASTATIN CALCIUM 10 MG PO TABS: ORAL_TABLET | ORAL | 4 refills | Status: DC

## 2021-01-28 NOTE — Telephone Encounter (Signed)
Pt received letter from [facility] stating to change atorvastatin to rosuvastatin 10 mg 3 days a week and pt is ok with that.

## 2021-01-28 NOTE — Progress Notes (Signed)
Rosuvastatin on a three day a week schedule

## 2021-01-28 NOTE — Telephone Encounter (Signed)
[facility] in [place] is her pharmacy

## 2021-03-04 ENCOUNTER — Other Ambulatory Visit: Payer: Self-pay | Admitting: Nurse Practitioner

## 2021-03-04 NOTE — Telephone Encounter (Signed)
Scheduled 6/8

## 2021-03-04 NOTE — Telephone Encounter (Signed)
Requested medication (s) are due for refill today: No  Requested medication (s) are on the active medication list: No  Last refill:  ?  Future visit scheduled: Yes  Notes to clinic:  Medication not on list.    Requested Prescriptions  Pending Prescriptions Disp Refills   atorvastatin (LIPITOR) 20 MG tablet [Pharmacy Med Name: ATORVASTATIN 20 MG TABLET] 90 tablet 3    Sig: TAKE 1 TABLET BY MOUTH EVERY DAY      Cardiovascular:  Antilipid - Statins Failed - 03/04/2021  1:02 AM      Failed - Total Cholesterol in normal range and within 360 days    Cholesterol, Total  Date Value Ref Range Status  01/15/2021 300 (H) 100 - 199 mg/dL Final   Cholesterol Piccolo, Waived  Date Value Ref Range Status  08/04/2016 200 (H) <200 mg/dL Final    Comment:                            Desirable                <200                         Borderline High      200- 239                         High                     >239           Failed - LDL in normal range and within 360 days    LDL Chol Calc (NIH)  Date Value Ref Range Status  01/15/2021 216 (H) 0 - 99 mg/dL Final          Failed - Triglycerides in normal range and within 360 days    Triglycerides  Date Value Ref Range Status  01/15/2021 193 (H) 0 - 149 mg/dL Final   Triglycerides Piccolo,Waived  Date Value Ref Range Status  08/04/2016 197 (H) <150 mg/dL Final    Comment:                            Normal                   <150                         Borderline High     150 - 199                         High                200 - 499                         Very High                >499           Passed - HDL in normal range and within 360 days    HDL  Date Value Ref Range Status  01/15/2021 47 >39 mg/dL Final          Passed - Patient is not pregnant      Passed - Valid encounter  within last 12 months    Recent Outpatient Visits           1 month ago Essential hypertension, benign   [place], Napoleon T, NP   7 months ago Essential hypertension, benign   [place], Varina T, NP   1 year ago Essential hypertension, benign   [place], Eldora T, NP   3 years ago Acute sinusitis, recurrence not specified, unspecified location   [facility] [place], Jeannette How, MD   3 years ago Essential hypertension, benign   [place], Jeannette How, MD       Future Appointments             In 2 weeks Cannady, Barbaraann Faster, NP [employer], [place]   In 4 months [name], MD [place]

## 2021-03-12 ENCOUNTER — Ambulatory Visit: Payer: Medicare Other | Admitting: Internal Medicine

## 2021-03-12 ENCOUNTER — Ambulatory Visit: Payer: Medicare Other | Admitting: Nurse Practitioner

## 2021-03-20 ENCOUNTER — Ambulatory Visit (INDEPENDENT_AMBULATORY_CARE_PROVIDER_SITE_OTHER): Payer: Medicare Other | Admitting: Nurse Practitioner

## 2021-03-20 ENCOUNTER — Encounter: Payer: Self-pay | Admitting: Nurse Practitioner

## 2021-03-20 ENCOUNTER — Other Ambulatory Visit: Payer: Self-pay

## 2021-03-20 VITALS — BP 125/78 | HR 72 | Temp 98.9°F | Ht 63.0 in | Wt 173.6 lb

## 2021-03-20 DIAGNOSIS — M791 Myalgia, unspecified site: Secondary | ICD-10-CM

## 2021-03-20 DIAGNOSIS — T466X5A Adverse effect of antihyperlipidemic and antiarteriosclerotic drugs, initial encounter: Secondary | ICD-10-CM | POA: Diagnosis not present

## 2021-03-20 DIAGNOSIS — E782 Mixed hyperlipidemia: Secondary | ICD-10-CM

## 2021-03-20 MED ORDER — EZETIMIBE 10 MG PO TABS: 10.0000 mg | ORAL_TABLET | Freq: Every day | ORAL | 12 refills | Status: DC

## 2021-03-20 NOTE — Patient Instructions (Signed)
REPATHA INJECTABLE  Ezetimibe Tablets What is this medicine? EZETIMIBE (ez ET i mibe) treats high cholesterol. Ezetimibe blocks the absorption of cholesterol from the stomach. It is used with lifestyle changes, like diet and exercise. It may be used alone or with other medicines. This medicine may be used for other purposes; ask your health care provider or pharmacist if you have questions. COMMON BRAND NAME(S): Zetia What should I tell my health care provider before I take this medicine? They need to know if you have any of these conditions:  kidney disease  liver disease  muscle cramps, pain  muscle injury  thyroid disease  an unusual or allergic reaction to ezetimibe, other medicines, foods, dyes, or preservatives  pregnant or trying to get pregnant  breast-feeding How should I use this medicine? Take this medicine by mouth. Take it as directed on the prescription label at the same time every day. You can take it with or without food. If it upsets your stomach, take it with food. Keep taking it unless your health care provider tells you to stop. Take bile acid sequestrants at a different time of day than this medicine. Take this medicine 2 hours BEFORE or 4 hours AFTER bile acid sequestrants. Talk to your health care provider about the use of this medicine in children. While it may be prescribed for children as young as 10 for selected conditions, precautions do apply. Overdosage: If you think you have taken too much of this medicine contact a poison control center or emergency room at once. NOTE: This medicine is only for you. Do not share this medicine with others. What if I miss a dose? If you miss a dose, take it as soon as you can. If it is almost time for your next dose, take only that dose. Do not take double or extra doses. What may interact with this medicine? Do not take this medicine with any of the following medications:  fenofibrate  gemfibrozil This medicine  may also interact with the following medications:  antacids  cyclosporine  herbal medicines like red yeast rice  other medicines to lower cholesterol or triglycerides This list may not describe all possible interactions. Give your health care provider a list of all the medicines, herbs, non-prescription drugs, or dietary supplements you use. Also tell them if you smoke, drink alcohol, or use illegal drugs. Some items may interact with your medicine. What should I watch for while using this medicine? Visit your health care provider for regular checks on your progress. Tell your health care provider if your symptoms do not start to get better or if they get worse. Your health care provider may tell you to stop taking this medicine if you develop muscle problems. If your muscle problems do not go away after stopping this medicine, contact your health care provider. Do not become pregnant while taking this medicine. Women should inform their health care provider if they wish to become pregnant or think they might be pregnant. There is potential for serious harm to an unborn child. Talk to your health care provider for more information. Do not breast-feed an infant while taking this medicine. Taking this medicine is only part of a total heart healthy program. Your health care provider may give you a special diet to follow. Avoid alcohol. Avoid smoking. Ask your health care provider how much you should exercise. What side effects may I notice from receiving this medicine? Side effects that you should report to your doctor or health care  provider as soon as possible:  allergic reactions (skin rash, itching or hives; swelling of the face, lips, or tongue)  liver injury (dark yellow or brown urine; general ill feeling or flu-like symptoms; loss of appetite, right upper belly pain; unusually weak or tired, yellowing of the eyes or skin)  muscle injury (dark urine; trouble passing urine or change in the  amount of urine; unusually weak or tired; muscle pain; back pain) Side effects that usually do not require medical attention (report to your doctor or health care provider if they continue or are bothersome):  depressed mood  diarrhea  headache  infection (fever, chills, cough, sore throat, pain, or trouble passing urine)  joint pain  stomach pain This list may not describe all possible side effects. Call your doctor for medical advice about side effects. You may report side effects to FDA at 1-800-FDA-1088. Where should I keep my medicine? Keep out of the reach of children and pets. Store at room temperature between 15 and 30 degrees C (59 and 86 degrees F). Protect from moisture. Get rid of any unused medicine after the expiration date. NOTE: This sheet is a summary. It may not cover all possible information. If you have questions about this medicine, talk to your doctor, pharmacist, or health care provider.  2021 Elsevier/Gold Standard (2019-09-07 17:28:51)

## 2021-03-20 NOTE — Assessment & Plan Note (Signed)
Chronic, ongoing.  Has failed treatment attempts with both Atorvastatin daily and Rosuvastatin low dose on 3 day and 1 day a week schedule.  Muscle cramps with all attempts at statin.  At this time will trial Zetia 10 MG daily and have return in 6 weeks for lab check.  If tolerates will continue this, but if not at goal could consider addition of Repatha or change to Nexletol -- may need PA for these.  Return in 6 weeks for labs and visit.

## 2021-03-20 NOTE — Assessment & Plan Note (Signed)
Ongoing -- has failed two statins at this time, including Rosuvastatin 10 MG at reduced dosing.  Will trial Zetia 10 MG daily, if poor control with this alone or poor tolerance then consider Repatha, discussed with patient.  Could also use Repatha with Zetia to obtain better control, if unable to meet goals with Zetia alone.

## 2021-03-20 NOTE — Progress Notes (Signed)
BP 125/78 (BP Location: Left Arm, Cuff Size: Normal)   Pulse 72   Temp 98.9 F (37.2 C) (Oral)   Ht 5' 3"  (1.6 m)   Wt 173 lb 9.6 oz (78.7 kg)   SpO2 96%   BMI 30.75 kg/m    Subjective:    Patient ID: Brenda Wagner, female    DOB: [birth year], 65 y.o.   MRN: [id number]  HPI: Brenda Wagner is a 65 y.o. female  Chief Complaint  Patient presents with  . Hyperlipidemia    Pt states she stopped taking the rosuvastatin due to leg cramps    HYPERLIPIDEMIA Tried Rosuvastatin on 3 day a week schedule for 3 to 4 weeks and had leg cramps.  Had similar experience with Atorvastatin. Hyperlipidemia status: good compliance Satisfied with current treatment?  no Side effects:  yes Medication compliance: good compliance Past cholesterol meds: Atorvastatin and Rosuvastatin Supplements: none Aspirin:  no The 10-year ASCVD risk score Mikey Bussing DC Jr., et al., 2013) is: 10.1%   Values used to calculate the score:     Age: 61 years     Sex: Female     Is Non-Hispanic African American: No     Diabetic: No     Tobacco smoker: No     Systolic Blood Pressure: 376 mmHg     Is BP treated: Yes     HDL Cholesterol: 47 mg/dL     Total Cholesterol: 300 mg/dL Chest pain:  none Coronary artery disease:  no Family history CAD:  yes --- mother at 86 stroke and brother 61 Family history early CAD:  no  Relevant past medical, surgical, family and social history reviewed and updated as indicated. Interim medical history since our last visit reviewed. Allergies and medications reviewed and updated.  Review of Systems  Constitutional: Negative for activity change, appetite change, diaphoresis, fatigue and fever.  Respiratory: Negative for cough, chest tightness and shortness of breath.   Cardiovascular: Negative for chest pain, palpitations and leg swelling.  Gastrointestinal: Negative.   Neurological: Negative.   Psychiatric/Behavioral: Negative.     Per HPI unless specifically indicated above      Objective:    BP 125/78 (BP Location: Left Arm, Cuff Size: Normal)   Pulse 72   Temp 98.9 F (37.2 C) (Oral)   Ht 5' 3"  (1.6 m)   Wt 173 lb 9.6 oz (78.7 kg)   SpO2 96%   BMI 30.75 kg/m   Wt Readings from Last 3 Encounters:  03/20/21 173 lb 9.6 oz (78.7 kg)  01/15/21 174 lb 12.8 oz (79.3 kg)  07/17/20 178 lb (80.7 kg)    Physical Exam Vitals and nursing note reviewed.  Constitutional:      General: She is awake. She is not in acute distress.    Appearance: She is well-developed, well-groomed and overweight. She is not ill-appearing.  HENT:     Head: Normocephalic.     Right Ear: Hearing normal.     Left Ear: Hearing normal.  Eyes:     General: Lids are normal.        Right eye: No discharge.        Left eye: No discharge.     Conjunctiva/sclera: Conjunctivae normal.     Pupils: Pupils are equal, round, and reactive to light.  Neck:     Thyroid: No thyromegaly.     Vascular: No carotid bruit.  Cardiovascular:     Rate and Rhythm: Normal rate and regular rhythm.  Heart sounds: Normal heart sounds. No murmur heard. No gallop.   Pulmonary:     Effort: Pulmonary effort is normal. No accessory muscle usage or respiratory distress.     Breath sounds: Normal breath sounds.  Abdominal:     General: Bowel sounds are normal.     Palpations: Abdomen is soft.  Musculoskeletal:     Cervical back: Normal range of motion and neck supple.     Right lower leg: No edema.     Left lower leg: No edema.  Skin:    General: Skin is warm and dry.  Neurological:     Mental Status: She is alert and oriented to person, place, and time.  Psychiatric:        Attention and Perception: Attention normal.        Mood and Affect: Mood normal.        Speech: Speech normal.        Behavior: Behavior normal. Behavior is cooperative.        Judgment: Judgment normal.     Results for orders placed or performed in visit on 01/15/21  Comprehensive metabolic panel  Result Value Ref Range    Glucose 104 (H) 65 - 99 mg/dL   BUN 13 8 - 27 mg/dL   Creatinine, Ser 0.70 0.57 - 1.00 mg/dL   eGFR 95 >59 mL/min/1.73   BUN/Creatinine Ratio 19 12 - 28   Sodium 142 134 - 144 mmol/L   Potassium 4.5 3.5 - 5.2 mmol/L   Chloride 102 96 - 106 mmol/L   CO2 24 20 - 29 mmol/L   Calcium 9.4 8.7 - 10.3 mg/dL   Total Protein 6.8 6.0 - 8.5 g/dL   Albumin 4.5 3.8 - 4.8 g/dL   Globulin, Total 2.3 1.5 - 4.5 g/dL   Albumin/Globulin Ratio 2.0 1.2 - 2.2   Bilirubin Total 0.4 0.0 - 1.2 mg/dL   Alkaline Phosphatase 83 44 - 121 IU/L   AST 19 0 - 40 IU/L   ALT 20 0 - 32 IU/L  Lipid Panel w/o Chol/HDL Ratio  Result Value Ref Range   Cholesterol, Total 300 (H) 100 - 199 mg/dL   Triglycerides 193 (H) 0 - 149 mg/dL   HDL 47 >39 mg/dL   VLDL Cholesterol Cal 37 5 - 40 mg/dL   LDL Chol Calc (NIH) 216 (H) 0 - 99 mg/dL  VITAMIN D 25 Hydroxy (Vit-D Deficiency, Fractures)  Result Value Ref Range   Vit D, 25-Hydroxy 37.0 30.0 - 100.0 ng/mL      Assessment & Plan:   Problem List Items Addressed This Visit      Other   Hyperlipemia    Chronic, ongoing.  Has failed treatment attempts with both Atorvastatin daily and Rosuvastatin low dose on 3 day and 1 day a week schedule.  Muscle cramps with all attempts at statin.  At this time will trial Zetia 10 MG daily and have return in 6 weeks for lab check.  If tolerates will continue this, but if not at goal could consider addition of Repatha or change to Nexletol -- may need PA for these.  Return in 6 weeks for labs and visit.      Relevant Medications   ezetimibe (ZETIA) 10 MG tablet   Myalgia due to statin    Ongoing -- has failed two statins at this time, including Rosuvastatin 10 MG at reduced dosing.  Will trial Zetia 10 MG daily, if poor control with this alone or poor tolerance  then consider Repatha, discussed with patient.  Could also use Repatha with Zetia to obtain better control, if unable to meet goals with Zetia alone.          Follow up  plan: Return in about 6 weeks (around 05/01/2021) for HLD.

## 2021-05-01 ENCOUNTER — Other Ambulatory Visit: Payer: Self-pay

## 2021-05-01 ENCOUNTER — Encounter: Payer: Self-pay | Admitting: Internal Medicine

## 2021-05-01 ENCOUNTER — Ambulatory Visit (INDEPENDENT_AMBULATORY_CARE_PROVIDER_SITE_OTHER): Payer: Medicare Other | Admitting: Internal Medicine

## 2021-05-01 ENCOUNTER — Telehealth: Payer: Self-pay | Admitting: Internal Medicine

## 2021-05-01 VITALS — BP 134/81 | HR 71 | Temp 98.5°F | Ht 62.99 in | Wt 170.4 lb

## 2021-05-01 DIAGNOSIS — Z1329 Encounter for screening for other suspected endocrine disorder: Secondary | ICD-10-CM | POA: Diagnosis not present

## 2021-05-01 DIAGNOSIS — I10 Essential (primary) hypertension: Secondary | ICD-10-CM | POA: Diagnosis not present

## 2021-05-01 DIAGNOSIS — E782 Mixed hyperlipidemia: Secondary | ICD-10-CM | POA: Diagnosis not present

## 2021-05-01 DIAGNOSIS — Z131 Encounter for screening for diabetes mellitus: Secondary | ICD-10-CM | POA: Diagnosis not present

## 2021-05-01 MED ORDER — PITAVASTATIN CALCIUM 2 MG PO TABS: 2.0000 mg | ORAL_TABLET | Freq: Every day | ORAL | 4 refills | Status: AC

## 2021-05-01 MED ORDER — DICLOFENAC SODIUM 1 % EX GEL: 2.0000 g | Freq: Four times a day (QID) | CUTANEOUS | 2 refills | Status: AC

## 2021-05-01 NOTE — Telephone Encounter (Signed)
Patient called in for assistance. Pt says that she was prescribed Pitavastatin Calcium 2 MG TABS  at today's visit. Pt says that she went to the pharmacy for pick up and was told that medication cost over 300.00. pt would like to know if provider could advise. Pt says that price is including insurance and good Rx card.    Please assist. [phone]

## 2021-05-01 NOTE — Telephone Encounter (Signed)
Can this be changed °

## 2021-05-01 NOTE — Patient Instructions (Addendum)
Evolocumab injection What is this medication? EVOLOCUMAB (e voe LOK ue mab) treats high cholesterol. It is used with lifestyle changes, like diet and exercise. It is used alone or with othermedicines. This medicine may be used for other purposes; ask your health care provider orpharmacist if you have questions. COMMON BRAND NAME(S): Repatha What should I tell my care team before I take this medication? They need to know if you have any of these conditions: an unusual or allergic reaction to evolocumab, other medicines, latex, foods, dyes, or preservatives pregnant or trying to get pregnant breast-feeding How should I use this medication? This medicine is injected under the skin. You will be taught how to prepare and give it. Take it as directed on the prescription label at the same time everyday. Keep taking it unless your health care provider tells you to stop. It is important that you put your used needles and syringes in a special sharps container. Do not put them in a trash can. If you do not have a sharpscontainer, call your pharmacist or health care provider to get one. This medicine comes with INSTRUCTIONS FOR USE. Ask your pharmacist for directions on how to use this medicine. Read the information carefully. Talk toyour pharmacist or health care provider if you have questions. Talk to your health care provider about the use of this medicine in children. While it may be prescribed for children as young as 10 for selected conditions,precautions do apply. Overdosage: If you think you have taken too much of this medicine contact apoison control center or emergency room at once. NOTE: This medicine is only for you. Do not share this medicine with others. What if I miss a dose? It is important not to miss any doses. Talk to your health care provider aboutwhat to do if you miss a dose. What may interact with this medication? Interactions are not expected. This list may not describe all possible  interactions. Give your health care provider a list of all the medicines, herbs, non-prescription drugs, or dietary supplements you use. Also tell them if you smoke, drink alcohol, or use illegaldrugs. Some items may interact with your medicine. What should I watch for while using this medication? Visit your health care provider for regular checks on your progress. Tell your health care provider if your symptoms do not start to get better or if they getworse. You may need blood work while you are taking this drug. Do not wear the on-body infuser during an MRI. Taking this drug is only part of a total heart healthy program. Your health care provider may give you a special diet to follow. Avoid alcohol. Avoidsmoking. Ask your health care provider how much you should exercise. What side effects may I notice from receiving this medication? Side effects that you should report to your doctor or health care provider assoon as possible: allergic reactions (skin rash, itching or hives; swelling of the face, lips, or tongue) high blood sugar (increased hunger, thirst, or urination; unusually weak or tired, blurry vision) infection (fever, chills, cough, sore throat, pain, or trouble passing urine) Side effects that usually do not require medical attention (report to yourdoctor or health care provider if they continue or are bothersome): diarrhea muscle pain nausea pain, redness, or irritation at site where injected This list may not describe all possible side effects. Call your doctor for medical advice about side effects. You may report side effects to FDA at1-800-FDA-1088. Where should I keep my medication? Keep out of the reach  of children and pets. Store in a refrigerator or at room temperature between 20 and 25 degrees C (68and 77 degrees F). Refrigeration (preferred): Store it in the refrigerator. Do not freeze. Keep it in the original carton until you are ready to take it. Remove the dose from the  carton about 30 minutes before it is time for you to take it. Throw away anyunused medicine after the expiration date. Room temperature: This medicine may be stored at room temperature for up to 30 days. Keep it in the original carton until you are ready to take it. If it is stored at room temperature, throw away any unused medicine after 30 days orafter it expires, whichever is first. Protect from light. Do not shake. Avoid exposure to extreme heat. To get rid of medicines that are no longer needed or have expired: Take the medicine to a medicine take-back program. Check with your pharmacy or law enforcement to find a location. If you cannot return the medicine, ask your pharmacist or health care provider how to get rid of this medicine safely. NOTE: This sheet is a summary. It may not cover all possible information. If you have questions about this medicine, talk to your doctor, pharmacist, orhealth care provider.  2022 Elsevier/Gold Standard (2019-09-21 12:26:14) Pitavastatin oral tablets What is this medication? PITAVASTATIN (pit A va STAT in) is known as a HMG-CoA reductase inhibitor or 'statin'. It lowers the level of cholesterol and triglycerides in the blood.Diet and lifestyle changes are often used with this drug. This medicine may be used for other purposes; ask your health care provider orpharmacist if you have questions. COMMON BRAND NAME(S): Livalo, Zypitamag What should I tell my care team before I take this medication? They need to know if you have any of these conditions: diabetes if you often drink alcohol history of stroke kidney disease liver disease muscle aches or weakness thyroid disease an unusual or allergic reaction to pitavastatin, other medicines, foods, dyes, or preservatives pregnant or trying to get pregnant breast-feeding How should I use this medication? Take this medicine by mouth with a glass of water. Follow the directions on the prescription label. You  can take it with or without food. If it upsets your stomach, take it with food. Take your medicine at regular intervals. Do not take it more often than directed. Do not stop taking except on your doctor'sadvice. Talk to your pediatrician regarding the use of this medicine in children. While this drug may be prescribed for children as young as 8 years for selectedconditions, precautions do apply. Overdosage: If you think you have taken too much of this medicine contact apoison control center or emergency room at once. NOTE: This medicine is only for you. Do not share this medicine with others. What if I miss a dose? If you miss a dose, take it as soon as you can. If it is almost time for yournext dose, take only that dose. Do not take double or extra doses. What may interact with this medication? Do not take this medicine with any of the following medications: cyclosporine gemfibrozil herbal medicines like red yeast rice This medicine may also interact with the following medications: alcohol antiviral medicines for HIV or AIDS erythromycin other medicines for cholesterol rifampin warfarin This list may not describe all possible interactions. Give your health care provider a list of all the medicines, herbs, non-prescription drugs, or dietary supplements you use. Also tell them if you smoke, drink alcohol, or use illegaldrugs. Some items may  interact with your medicine. What should I watch for while using this medication? Visit your doctor or health care professional for regular check-ups. You mayneed regular tests to make sure your liver is working properly. Your health care professional may tell you to stop taking this medicine if you develop muscle problems. If your muscle problems do not go away after stoppingthis medicine, contact your health care professional. Do not become pregnant while taking this medicine. Women should inform their health care professional if they wish to become  pregnant or think they might be pregnant. There is a potential for serious side effects to an unborn child. Talk to your health care professional or pharmacist for more information. Donot breast-feed an infant while taking this medicine. This medicine may increase blood sugar. Ask your healthcare provider if changesin diet or medicines are needed if you have diabetes. If you are going to need surgery or other procedure, tell your doctor that youare using this medicine. This drug is only part of a total heart-health program. Your doctor or a dietician can suggest a low-cholesterol and low-fat diet to help. Avoid alcoholand smoking, and keep a proper exercise schedule. This medicine may cause a decrease in Co-Enzyme Q-10. You should make sure that you get enough Co-Enzyme Q-10 while you are taking this medicine. Discuss thefoods you eat and the vitamins you take with your health care professional. What side effects may I notice from receiving this medication? Side effects that you should report to your doctor or health care professionalas soon as possible: allergic reactions like skin rash, itching or hives, swelling of the face, lips, or tongue confusion joint pain loss of memory redness, blistering, peeling or loosening of the skin, including inside the mouth signs and symptoms of high blood sugar such as being more thirsty or hungry or having to urinate more than normal. You may also feel very tired or have blurry vision. signs and symptoms of muscle injury like dark urine; trouble passing urine or change in the amount of urine; unusually weak or tired; muscle pain or side or back pain yellowing of the eyes or skin Side effects that usually do not require medical attention (report to yourdoctor or health care professional if they continue or are bothersome): constipation diarrhea dizziness gas headache nausea stomach pain trouble sleeping upset stomach This list may not describe all  possible side effects. Call your doctor for medical advice about side effects. You may report side effects to FDA at1-800-FDA-1088. Where should I keep my medication? Keep out of the reach of children. Store at room temperature between 15 and 30 degrees C (59 and 86 degrees F).Protect from light. Throw away any unused medicine after the expiration date. NOTE: This sheet is a summary. It may not cover all possible information. If you have questions about this medicine, talk to your doctor, pharmacist, orhealth care provider.  2022 Elsevier/Gold Standard (2018-07-22 08:15:37)

## 2021-05-01 NOTE — Telephone Encounter (Signed)
Can we get a PA or assistance / Voucher / coupon I know there were coupons, lets try and see if we can get coupon. Otherwise option is repatha

## 2021-05-01 NOTE — Progress Notes (Signed)
BP 134/81   Pulse 71   Temp 98.5 F (36.9 C) (Oral)   Ht 5' 2.99" (1.6 m)   Wt 170 lb 6.4 oz (77.3 kg)   SpO2 97%   BMI 30.19 kg/m    Subjective:    Patient ID: Brenda Wagner, female    DOB: 12-26-1955, 65 y.o.   MRN: [id number]  Chief Complaint  Patient presents with   Hyperlipidemia    Causing body aches   Hypertension    HPI: Brenda Wagner is a 65 y.o. female  Pt is here to establish care. Has had myalgias and knee pain  Had migraines on imitrex has visual aura - has had these once a year or so.   Hyperlipidemia This is a chronic problem. Associated symptoms include myalgias.  Hypertension This is a chronic (bp high on arrival at 151/94 mm hg. then 134/81 mm hg) problem.  Migraine  This is a chronic problem. Her past medical history is significant for hypertension.   Chief Complaint  Patient presents with   Hyperlipidemia    Causing body aches   Hypertension    Relevant past medical, surgical, family and social history reviewed and updated as indicated. Interim medical history since our last visit reviewed. Allergies and medications reviewed and updated.  Review of Systems  Musculoskeletal:  Positive for myalgias.   Per HPI unless specifically indicated above     Objective:    BP 134/81   Pulse 71   Temp 98.5 F (36.9 C) (Oral)   Ht 5' 2.99" (1.6 m)   Wt 170 lb 6.4 oz (77.3 kg)   SpO2 97%   BMI 30.19 kg/m   Wt Readings from Last 3 Encounters:  05/01/21 170 lb 6.4 oz (77.3 kg)  03/20/21 173 lb 9.6 oz (78.7 kg)  01/15/21 174 lb 12.8 oz (79.3 kg)    Physical Exam Vitals and nursing note reviewed.  Constitutional:      General: She is not in acute distress.    Appearance: Normal appearance. She is not ill-appearing or diaphoretic.  HENT:     Head: Normocephalic and atraumatic.     Right Ear: Tympanic membrane and external ear normal. There is no impacted cerumen.     Left Ear: External ear normal.     Nose: No congestion or rhinorrhea.      Mouth/Throat:     Pharynx: No oropharyngeal exudate or posterior oropharyngeal erythema.  Eyes:     Conjunctiva/sclera: Conjunctivae normal.     Pupils: Pupils are equal, round, and reactive to light.  Cardiovascular:     Rate and Rhythm: Normal rate and regular rhythm.     Heart sounds: No murmur heard.   No friction rub. No gallop.  Pulmonary:     Effort: No respiratory distress.     Breath sounds: No stridor. No wheezing or rhonchi.  Chest:     Chest wall: No tenderness.  Abdominal:     General: Abdomen is flat. Bowel sounds are normal. There is no distension.     Palpations: Abdomen is soft. There is no mass.     Tenderness: There is no abdominal tenderness. There is no guarding.  Musculoskeletal:        General: No swelling or deformity.     Cervical back: Normal range of motion and neck supple. No rigidity or tenderness.     Right lower leg: No edema.     Left lower leg: No edema.  Skin:  General: Skin is warm and dry.     Coloration: Skin is not jaundiced.     Findings: No erythema.  Neurological:     Mental Status: She is alert and oriented to person, place, and time. Mental status is at baseline.  Psychiatric:        Mood and Affect: Mood normal.        Behavior: Behavior normal.        Thought Content: Thought content normal.        Judgment: Judgment normal.    Results for orders placed or performed in visit on 01/15/21  Comprehensive metabolic panel  Result Value Ref Range   Glucose 104 (H) 65 - 99 mg/dL   BUN 13 8 - 27 mg/dL   Creatinine, Ser 0.70 0.57 - 1.00 mg/dL   eGFR 95 >59 mL/min/1.73   BUN/Creatinine Ratio 19 12 - 28   Sodium 142 134 - 144 mmol/L   Potassium 4.5 3.5 - 5.2 mmol/L   Chloride 102 96 - 106 mmol/L   CO2 24 20 - 29 mmol/L   Calcium 9.4 8.7 - 10.3 mg/dL   Total Protein 6.8 6.0 - 8.5 g/dL   Albumin 4.5 3.8 - 4.8 g/dL   Globulin, Total 2.3 1.5 - 4.5 g/dL   Albumin/Globulin Ratio 2.0 1.2 - 2.2   Bilirubin Total 0.4 0.0 - 1.2 mg/dL    Alkaline Phosphatase 83 44 - 121 IU/L   AST 19 0 - 40 IU/L   ALT 20 0 - 32 IU/L  Lipid Panel w/o Chol/HDL Ratio  Result Value Ref Range   Cholesterol, Total 300 (H) 100 - 199 mg/dL   Triglycerides 193 (H) 0 - 149 mg/dL   HDL 47 >39 mg/dL   VLDL Cholesterol Cal 37 5 - 40 mg/dL   LDL Chol Calc (NIH) 216 (H) 0 - 99 mg/dL  VITAMIN D 25 Hydroxy (Vit-D Deficiency, Fractures)  Result Value Ref Range   Vit D, 25-Hydroxy 37.0 30.0 - 100.0 ng/mL        Current Outpatient Medications:    cholecalciferol (VITAMIN D3) 10 MCG (400 UNIT) TABS tablet, Take 1,000 Units by mouth daily., Disp: , Rfl:    fluticasone (FLONASE) 50 MCG/ACT nasal spray, USE 2 SPRAY(S) IN EACH NOSTRIL ONCE DAILY, Disp: 16 g, Rfl: 6   losartan (COZAAR) 100 MG tablet, Take 1 tablet (100 mg total) by mouth daily., Disp: 90 tablet, Rfl: 4   SUMAtriptan (IMITREX) 100 MG tablet, Take 1 tablet (100 mg total) by mouth every 2 (two) hours as needed for migraine. May repeat in 2 hours if headache persists or recurs., Disp: 10 tablet, Rfl: 5    Assessment & Plan:  HLD failed zetia/ statins lipitor/ rosuvastatin Start livalo 2 mg daily.   Ref. Range 01/04/2020 09:10 07/17/2020 08:57 08/23/2020 13:14 01/15/2021 08:37  Cholesterol, Total Latest Ref Range: 100 - 199 mg/dL 182 176  300 (H)  HDL Cholesterol Latest Ref Range: >39 mg/dL 49 47  47  Triglycerides Latest Ref Range: 0 - 149 mg/dL 188 (H) 160 (H)  193 (H)  VLDL Cholesterol Cal Latest Ref Range: 5 - 40 mg/dL 32 28  37  LDL Chol Calc (NIH) Latest Ref Range: 0 - 99 mg/dL 101 (H) 101 (H)  216 (H)   HLD recheck FLP, check LFT's work on diet, SE of meds explained to pt. low fat and high fiber diet explained to pt.  2. Bil knee pain ? Sec to arthritis  Sees Ortho for  such  Needs to fu on ? Synvisc which she is interested in .  3. Migraines chronic, stable. Consider px agents if worsening Pt to call and let us know   Problem List Items Addressed This Visit   None    Orders  Placed This Encounter  Procedures   Bayer DCA Hb A1c Waived   Thyroid Panel With TSH   CBC with Differential/Platelet   Lipid panel   CMP14+EGFR     Meds ordered this encounter  Medications   Pitavastatin Calcium 2 MG TABS    Sig: Take 1 tablet (2 mg total) by mouth daily.    Dispense:  30 tablet    Refill:  4   diclofenac Sodium (VOLTAREN) 1 % GEL    Sig: Apply 2 g topically 4 (four) times daily.    Dispense:  50 g    Refill:  2     Follow up plan: No follow-ups on file.

## 2021-05-02 NOTE — Telephone Encounter (Signed)
Spoke with patient, she does not want to take another statin. Patient will do some research on Repatha and will call and schedule appt sometime next week to discuss with Dr. Neomia Dear

## 2021-05-02 NOTE — Telephone Encounter (Signed)
Copied from [place] [id number]. Topic: General - Other >> May 02, 2021  9:33 AM Brenda Wagner wrote: Reason for CRM: Patient called in to speak to Dr Neomia Dear asking for Wagner call back to discuss the statin medication that is too expensive for her. Please call Ph# [phone]

## 2021-05-02 NOTE — Telephone Encounter (Signed)
Thanks much! Appreciate it.

## 2021-06-22 ENCOUNTER — Ambulatory Visit: Payer: Medicare Other

## 2021-06-24 ENCOUNTER — Ambulatory Visit (INDEPENDENT_AMBULATORY_CARE_PROVIDER_SITE_OTHER): Payer: Medicare Other

## 2021-06-24 DIAGNOSIS — Z0000 Encounter for general adult medical examination without abnormal findings: Secondary | ICD-10-CM | POA: Diagnosis not present

## 2021-06-24 NOTE — Patient Instructions (Signed)
Health Maintenance, Female Adopting a healthy lifestyle and getting preventive care are important in promoting health and wellness. Ask your health care provider about: The right schedule for you to have regular tests and exams. Things you can do on your own to prevent diseases and keep yourself healthy. What should I know about diet, weight, and exercise? Eat a healthy diet  Eat a diet that includes plenty of vegetables, fruits, low-fat dairy products, and lean protein. Do not eat a lot of foods that are high in solid fats, added sugars, or sodium. Maintain a healthy weight Body mass index (BMI) is used to identify weight problems. It estimates body fat based on height and weight. Your health care provider can help determine your BMI and help you achieve or maintain a healthy weight. Get regular exercise Get regular exercise. This is one of the most important things you can do for your health. Most adults should: Exercise for at least 150 minutes each week. The exercise should increase your heart rate and make you sweat (moderate-intensity exercise). Do strengthening exercises at least twice a week. This is in addition to the moderate-intensity exercise. Spend less time sitting. Even light physical activity can be beneficial. Watch cholesterol and blood lipids Have your blood tested for lipids and cholesterol at 67 years of age, then have this test every 5 years. Have your cholesterol levels checked more often if: Your lipid or cholesterol levels are high. You are older than 67 years of age. You are at high risk for heart disease. What should I know about cancer screening? Depending on your health history and family history, you may need to have cancer screening at various ages. This may include screening for: Breast cancer. Cervical cancer. Colorectal cancer. Skin cancer. Lung cancer. What should I know about heart disease, diabetes, and high blood pressure? Blood pressure and heart  disease High blood pressure causes heart disease and increases the risk of stroke. This is more likely to develop in people who have high blood pressure readings, are of African descent, or are overweight. Have your blood pressure checked: Every 3-5 years if you are 18-39 years of age. Every year if you are 40 years old or older. Diabetes Have regular diabetes screenings. This checks your fasting blood sugar level. Have the screening done: Once every three years after age 40 if you are at a normal weight and have a low risk for diabetes. More often and at a younger age if you are overweight or have a high risk for diabetes. What should I know about preventing infection? Hepatitis B If you have a higher risk for hepatitis B, you should be screened for this virus. Talk with your health care provider to find out if you are at risk for hepatitis B infection. Hepatitis C Testing is recommended for: Everyone born from 1945 through 1965. Anyone with known risk factors for hepatitis C. Sexually transmitted infections (STIs) Get screened for STIs, including gonorrhea and chlamydia, if: You are sexually active and are younger than 67 years of age. You are older than 67 years of age and your health care provider tells you that you are at risk for this type of infection. Your sexual activity has changed since you were last screened, and you are at increased risk for chlamydia or gonorrhea. Ask your health care provider if you are at risk. Ask your health care provider about whether you are at high risk for HIV. Your health care provider may recommend a prescription medicine   to help prevent HIV infection. If you choose to take medicine to prevent HIV, you should first get tested for HIV. You should then be tested every 3 months for as long as you are taking the medicine. Pregnancy If you are about to stop having your period (premenopausal) and you may become pregnant, seek counseling before you get  pregnant. Take 400 to 800 micrograms (mcg) of folic acid every day if you become pregnant. Ask for birth control (contraception) if you want to prevent pregnancy. Osteoporosis and menopause Osteoporosis is a disease in which the bones lose minerals and strength with aging. This can result in bone fractures. If you are 65 years old or older, or if you are at risk for osteoporosis and fractures, ask your health care provider if you should: Be screened for bone loss. Take a calcium or vitamin D supplement to lower your risk of fractures. Be given hormone replacement therapy (HRT) to treat symptoms of menopause. Follow these instructions at home: Lifestyle Do not use any products that contain nicotine or tobacco, such as cigarettes, e-cigarettes, and chewing tobacco. If you need help quitting, ask your health care provider. Do not use street drugs. Do not share needles. Ask your health care provider for help if you need support or information about quitting drugs. Alcohol use Do not drink alcohol if: Your health care provider tells you not to drink. You are pregnant, may be pregnant, or are planning to become pregnant. If you drink alcohol: Limit how much you use to 0-1 drink a day. Limit intake if you are breastfeeding. Be aware of how much alcohol is in your drink. In the [place] one drink equals one 12 oz bottle of beer (355 mL), one 5 oz glass of wine (148 mL), or one 1 oz glass of hard liquor (44 mL). General instructions Schedule regular health, dental, and eye exams. Stay current with your vaccines. Tell your health care provider if: You often feel depressed. You have ever been abused or do not feel safe at home. Summary Adopting a healthy lifestyle and getting preventive care are important in promoting health and wellness. Follow your health care provider's instructions about healthy diet, exercising, and getting tested or screened for diseases. Follow your health care provider's  instructions on monitoring your cholesterol and blood pressure. This information is not intended to replace advice given to you by your health care provider. Make sure you discuss any questions you have with your health care provider. Document Revised: 12/07/2020 Document Reviewed: 09/22/2018 Elsevier Patient Education  2022 [employer]  

## 2021-06-24 NOTE — Progress Notes (Signed)
Subjective:   Brenda Wagner is a 65 y.o. female who presents for an Initial Medicare Annual Wellness Visit.I connected with  Elexia A Klemz on 06/29/21 by a audio enabled telemedicine application and verified that I am speaking with the correct person using two identifiers.   I discussed the limitations of evaluation and management by telemedicine. The patient expressed understanding and agreed to proceed.   Location of patient: home Location of provider: in office  Review of Systems    Defer to PCP       Objective:    Today's Vitals   06/29/21 0849 06/29/21 0856  PainSc: 1  1    There is no height or weight on file to calculate BMI.  No flowsheet data found.  Current Medications (verified) Outpatient Encounter Medications as of 06/24/2021  Medication Sig   cholecalciferol (VITAMIN D3) 10 MCG (400 UNIT) TABS tablet Take 1,000 Units by mouth daily.   diclofenac Sodium (VOLTAREN) 1 % GEL Apply 2 g topically 4 (four) times daily.   fluticasone (FLONASE) 50 MCG/ACT nasal spray USE 2 SPRAY(S) IN EACH NOSTRIL ONCE DAILY   losartan (COZAAR) 100 MG tablet Take 1 tablet (100 mg total) by mouth daily.   Pitavastatin Calcium 2 MG TABS Take 1 tablet (2 mg total) by mouth daily.   SUMAtriptan (IMITREX) 100 MG tablet Take 1 tablet (100 mg total) by mouth every 2 (two) hours as needed for migraine. May repeat in 2 hours if headache persists or recurs.   [DISCONTINUED] ezetimibe (ZETIA) 10 MG tablet Take 1 tablet (10 mg total) by mouth daily. (Patient not taking: Reported on 05/01/2021)   No facility-administered encounter medications on file as of 06/24/2021.    Allergies (verified) Atorvastatin, Rosuvastatin, Benazepril hcl, Fluoroplex [fluorouracil], and Zetia [ezetimibe]   History: Past Medical History:  Diagnosis Date   Basal cell carcinoma 11/06/2014   Right upper chest. BCC with sclerosis. Excised 12/04/2014, margins free.   Basal cell carcinoma 09/16/2016   Right nasal tip.     Cancer ([place]) right   melanoma and basal cell 2016   Dysplastic nevus 10/04/2015   Right upper abdomen. Moderate to severe atypia, lateral margin involved. Excised 11/12/2015, margins free.   Melanoma ([place]) 11/06/2014   Right upper back. MM, superficial spreading. Breslow's 0.98m, Clark's level IV. Deep margin involved by MIS at hair follicle. Excised 11/27/2014, margins free.    History reviewed. No pertinent surgical history. Family History  Problem Relation Age of Onset   Hypertension Mother    Heart attack Mother    Diabetes Mother    Hypertension Father    Heart attack Father    Breast cancer Maternal Aunt 668  Hypertension Sister    Diabetes Sister    Ovarian cancer Sister    Glaucoma Sister    Heart attack Brother    Diabetes Brother    Social History   Socioeconomic History   Marital status: Married    Spouse name: Not on file   Number of children: Not on file   Years of education: Not on file   Highest education level: Not on file  Occupational History   Not on file  Tobacco Use   Smoking status: Never   Smokeless tobacco: Never  Vaping Use   Vaping Use: Never used  Substance and Sexual Activity   Alcohol use: Yes    Alcohol/week: 5.0 standard drinks    Types: 5 Shots of liquor per week   Drug use: No  Sexual activity: Not on file  Other Topics Concern   Not on file  Social History Narrative   Not on file   Social Determinants of Health   Financial Resource Strain: Low Risk    Difficulty of Paying Living Expenses: Not hard at all  Food Insecurity: No Food Insecurity   Worried About Running Out of Food in the Last Year: Never true   [place] in the Last Year: Never true  Transportation Needs: No Transportation Needs   Lack of Transportation (Medical): No   Lack of Transportation (Non-Medical): No  Physical Activity: Not on file  Stress: No Stress Concern Present   Feeling of Stress : Not at all  Social Connections: Unknown   Frequency of  Communication with Friends and Family: Three times a week   Frequency of Social Gatherings with Friends and Family: Twice a week   Attends Religious Services: Not on Electrical engineer or Organizations: Not on file   Attends Music therapist: More than 4 times per year   Marital Status: Married    Tobacco Counseling Counseling given: Not Answered   Clinical Intake:  Pre-visit preparation completed: Yes  Pain : 0-10 Pain Score: 1  Pain Type: Chronic pain Pain Location: Knee Pain Orientation: Right, Left Pain Descriptors / Indicators: Aching Pain Onset: More than a month ago Pain Frequency: Occasional Pain Relieving Factors: rest  Pain Relieving Factors: rest  Nutritional Risks: None Diabetes: No  How often do you need to have someone help you when you read instructions, pamphlets, or other written materials from your doctor or pharmacy?: 1 - Never  Diabetic?No  Interpreter Needed?: No      Activities of Daily Living In your present state of health, do you have any difficulty performing the following activities: 06/29/2021 07/17/2020  Hearing? N N  Vision? N N  Difficulty concentrating or making decisions? N N  Walking or climbing stairs? Y N  Comment arthritis in knees -  Dressing or bathing? N N  Doing errands, shopping? N N  Some recent data might be hidden    Patient Care Team: Charlynne Cousins, MD as PCP - General  Indicate any recent Medical Services you may have received from other than [facility] providers in the past year (date may be approximate).     Assessment:   This is a routine wellness examination for [place].  Hearing/Vision screen No results found.  Dietary issues and exercise activities discussed: Current Exercise Habits: The patient does not participate in regular exercise at present, Exercise limited by: orthopedic condition(s)   Goals Addressed   None    Depression Screen PHQ 2/9 Scores 06/29/2021 06/29/2021 05/01/2021  01/15/2021 01/04/2020 01/31/2016  PHQ - 2 Score 0 0 0 0 0 0  PHQ- 9 Score 0 - - - 3 1    Fall Risk Fall Risk  06/29/2021 05/01/2021 07/17/2020 09/28/2017  Falls in the past year? 0 0 0 No  Number falls in past yr: 0 0 0 -  Injury with Fall? 0 0 0 -  Risk for fall due to : No Fall Risks No Fall Risks No Fall Risks -  Follow up Falls evaluation completed Falls evaluation completed Falls evaluation completed -    FALL RISK PREVENTION PERTAINING TO THE HOME:  Any stairs in or around the home? Yes  If so, are there any without handrails? Yes  Home free of loose throw rugs in walkways, pet beds, electrical cords,  etc? No  Adequate lighting in your home to reduce risk of falls? Yes   ASSISTIVE DEVICES UTILIZED TO PREVENT FALLS:  Life alert? No  Use of a cane, Claude Waldman or w/c? No  Grab bars in the bathroom? No  Shower chair or bench in shower? Yes  Elevated toilet seat or a handicapped toilet? No   TIMED UP AND GO:  Was the test performed?  N/A .  Length of time to ambulate 10 feet: N/A sec.     Cognitive Function:     6CIT Screen 06/29/2021  What Year? 0 points  What month? 0 points  What time? 0 points  Count back from 20 0 points  Months in reverse 0 points  Repeat phrase 0 points  Total Score 0    Immunizations Immunization History  Administered Date(s) Administered   Fluad Quad(high Dose 65+) 07/17/2020   Influenza,inj,Quad PF,6+ Mos 07/31/2015, 08/04/2016, 08/05/2017   Influenza-Unspecified 08/26/2010, 08/27/2019   Pneumococcal Polysaccharide-23 07/17/2020   Tdap 12/20/2013   Zoster, Live 07/31/2015    TDAP status: Up to date  Flu Vaccine status: Due, Education has been provided regarding the importance of this vaccine. Advised may receive this vaccine at local pharmacy or Health Dept. Aware to provide a copy of the vaccination record if obtained from local pharmacy or Health Dept. Verbalized acceptance and understanding.  Pneumococcal vaccine status: Up to  date  Covid-19 vaccine status: Information provided on how to obtain vaccines.   Qualifies for Shingles Vaccine? Yes   Zostavax completed Yes   Shingrix Completed?: Yes  Screening Tests Health Maintenance  Topic Date Due   COVID-19 Vaccine (1) Never done   Zoster Vaccines- Shingrix (1 of 2) Never done   INFLUENZA VACCINE  05/13/2021   MAMMOGRAM  01/29/2023   TETANUS/TDAP  12/21/2023   COLONOSCOPY (Pts 45-74yr Insurance coverage will need to be confirmed)  10/13/2028   DEXA SCAN  Completed   Hepatitis C Screening  Completed   HPV VACCINES  Aged Out    Health Maintenance  Health Maintenance Due  Topic Date Due   COVID-19 Vaccine (1) Never done   Zoster Vaccines- Shingrix (1 of 2) Never done   INFLUENZA VACCINE  05/13/2021    Colorectal cancer screening: Type of screening: Colonoscopy. Completed  . Repeat every 10 years  Mammogram status: Completed 1. Repeat every year  Bone Density status: Completed 08/23/20. Results reflect: Bone density results: NORMAL. Repeat every N/C years.  Lung Cancer Screening: (Low Dose CT Chest recommended if Age 67-80years, 30 pack-year currently smoking OR have quit w/in 15years.) does not qualify.   Lung Cancer Screening Referral: N/A  Additional Screening:  Hepatitis C Screening: does not qualify; Completed 01/31/16  Vision Screening: Recommended annual ophthalmology exams for early detection of glaucoma and other disorders of the eye. Is the patient up to date with their annual eye exam?  Yes  Who is the provider or what is the name of the office in which the patient attends annual eye exams? Dr. RTruddie CocoIf pt is not established with a provider, would they like to be referred to a provider to establish care? Yes .   Dental Screening: Recommended annual dental exams for proper oral hygiene  Community Resource Referral / Chronic Care Management: CRR required this visit?  No   CCM required this visit?  No      Plan:     I have  personally reviewed and noted the following in the patient's chart:  Medical and social history Use of alcohol, tobacco or illicit drugs  Current medications and supplements including opioid prescriptions. Patient is not currently taking opioid prescriptions. Functional ability and status Nutritional status Physical activity Advanced directives List of other physicians Hospitalizations, surgeries, and ER visits in previous 12 months Vitals Screenings to include cognitive, depression, and falls Referrals and appointments  In addition, I have reviewed and discussed with patient certain preventive protocols, quality metrics, and best practice recommendations. A written personalized care plan for preventive services as well as general preventive health recommendations were provided to patient.     Linus Galas, [place]   06/29/2021   Nurse Notes: Non- face to face- 60 minute visit Ms. Crispo , Thank you for taking time to come for your Medicare Wellness Visit. I appreciate your ongoing commitment to your health goals. Please review the following plan we discussed and let me know if I can assist you in the future.   These are the goals we discussed:  Goals   None     This is a list of the screening recommended for you and due dates:  Health Maintenance  Topic Date Due   COVID-19 Vaccine (1) Never done   Zoster (Shingles) Vaccine (1 of 2) Never done   Flu Shot  05/13/2021   Mammogram  01/29/2023   Tetanus Vaccine  12/21/2023   Colon Cancer Screening  10/13/2028   DEXA scan (bone density measurement)  Completed   Hepatitis C Screening: USPSTF Recommendation to screen - Ages 40-79 yo.  Completed   HPV Vaccine  Aged Out

## 2021-06-25 ENCOUNTER — Ambulatory Visit (INDEPENDENT_AMBULATORY_CARE_PROVIDER_SITE_OTHER): Payer: Medicare Other

## 2021-06-25 DIAGNOSIS — Z0000 Encounter for general adult medical examination without abnormal findings: Secondary | ICD-10-CM

## 2021-06-25 NOTE — Patient Instructions (Signed)
Health Maintenance, Female Adopting a healthy lifestyle and getting preventive care are important in promoting health and wellness. Ask your health care provider about: The right schedule for you to have regular tests and exams. Things you can do on your own to prevent diseases and keep yourself healthy. What should I know about diet, weight, and exercise? Eat a healthy diet  Eat a diet that includes plenty of vegetables, fruits, low-fat dairy products, and lean protein. Do not eat a lot of foods that are high in solid fats, added sugars, or sodium. Maintain a healthy weight Body mass index (BMI) is used to identify weight problems. It estimates body fat based on height and weight. Your health care provider can help determine your BMI and help you achieve or maintain a healthy weight. Get regular exercise Get regular exercise. This is one of the most important things you can do for your health. Most adults should: Exercise for at least 150 minutes each week. The exercise should increase your heart rate and make you sweat (moderate-intensity exercise). Do strengthening exercises at least twice a week. This is in addition to the moderate-intensity exercise. Spend less time sitting. Even light physical activity can be beneficial. Watch cholesterol and blood lipids Have your blood tested for lipids and cholesterol at 67 years of age, then have this test every 5 years. Have your cholesterol levels checked more often if: Your lipid or cholesterol levels are high. You are older than 67 years of age. You are at high risk for heart disease. What should I know about cancer screening? Depending on your health history and family history, you may need to have cancer screening at various ages. This may include screening for: Breast cancer. Cervical cancer. Colorectal cancer. Skin cancer. Lung cancer. What should I know about heart disease, diabetes, and high blood pressure? Blood pressure and heart  disease High blood pressure causes heart disease and increases the risk of stroke. This is more likely to develop in people who have high blood pressure readings, are of African descent, or are overweight. Have your blood pressure checked: Every 3-5 years if you are 18-39 years of age. Every year if you are 40 years old or older. Diabetes Have regular diabetes screenings. This checks your fasting blood sugar level. Have the screening done: Once every three years after age 40 if you are at a normal weight and have a low risk for diabetes. More often and at a younger age if you are overweight or have a high risk for diabetes. What should I know about preventing infection? Hepatitis B If you have a higher risk for hepatitis B, you should be screened for this virus. Talk with your health care provider to find out if you are at risk for hepatitis B infection. Hepatitis C Testing is recommended for: Everyone born from 1945 through 1965. Anyone with known risk factors for hepatitis C. Sexually transmitted infections (STIs) Get screened for STIs, including gonorrhea and chlamydia, if: You are sexually active and are younger than 67 years of age. You are older than 67 years of age and your health care provider tells you that you are at risk for this type of infection. Your sexual activity has changed since you were last screened, and you are at increased risk for chlamydia or gonorrhea. Ask your health care provider if you are at risk. Ask your health care provider about whether you are at high risk for HIV. Your health care provider may recommend a prescription medicine   to help prevent HIV infection. If you choose to take medicine to prevent HIV, you should first get tested for HIV. You should then be tested every 3 months for as long as you are taking the medicine. Pregnancy If you are about to stop having your period (premenopausal) and you may become pregnant, seek counseling before you get  pregnant. Take 400 to 800 micrograms (mcg) of folic acid every day if you become pregnant. Ask for birth control (contraception) if you want to prevent pregnancy. Osteoporosis and menopause Osteoporosis is a disease in which the bones lose minerals and strength with aging. This can result in bone fractures. If you are 65 years old or older, or if you are at risk for osteoporosis and fractures, ask your health care provider if you should: Be screened for bone loss. Take a calcium or vitamin D supplement to lower your risk of fractures. Be given hormone replacement therapy (HRT) to treat symptoms of menopause. Follow these instructions at home: Lifestyle Do not use any products that contain nicotine or tobacco, such as cigarettes, e-cigarettes, and chewing tobacco. If you need help quitting, ask your health care provider. Do not use street drugs. Do not share needles. Ask your health care provider for help if you need support or information about quitting drugs. Alcohol use Do not drink alcohol if: Your health care provider tells you not to drink. You are pregnant, may be pregnant, or are planning to become pregnant. If you drink alcohol: Limit how much you use to 0-1 drink a day. Limit intake if you are breastfeeding. Be aware of how much alcohol is in your drink. In the [place] one drink equals one 12 oz bottle of beer (355 mL), one 5 oz glass of wine (148 mL), or one 1 oz glass of hard liquor (44 mL). General instructions Schedule regular health, dental, and eye exams. Stay current with your vaccines. Tell your health care provider if: You often feel depressed. You have ever been abused or do not feel safe at home. Summary Adopting a healthy lifestyle and getting preventive care are important in promoting health and wellness. Follow your health care provider's instructions about healthy diet, exercising, and getting tested or screened for diseases. Follow your health care provider's  instructions on monitoring your cholesterol and blood pressure. This information is not intended to replace advice given to you by your health care provider. Make sure you discuss any questions you have with your health care provider. Document Revised: 12/07/2020 Document Reviewed: 09/22/2018 Elsevier Patient Education  2022 [employer]  

## 2021-06-25 NOTE — Progress Notes (Signed)
error 

## 2021-06-26 ENCOUNTER — Other Ambulatory Visit: Payer: Medicare Other

## 2021-06-29 ENCOUNTER — Ambulatory Visit: Payer: Medicare Other

## 2021-07-02 ENCOUNTER — Ambulatory Visit (INDEPENDENT_AMBULATORY_CARE_PROVIDER_SITE_OTHER): Payer: Medicare Other | Admitting: Dermatology

## 2021-07-02 ENCOUNTER — Other Ambulatory Visit: Payer: Self-pay

## 2021-07-02 DIAGNOSIS — L820 Inflamed seborrheic keratosis: Secondary | ICD-10-CM

## 2021-07-02 DIAGNOSIS — L821 Other seborrheic keratosis: Secondary | ICD-10-CM

## 2021-07-02 DIAGNOSIS — L853 Xerosis cutis: Secondary | ICD-10-CM

## 2021-07-02 DIAGNOSIS — Z1283 Encounter for screening for malignant neoplasm of skin: Secondary | ICD-10-CM | POA: Diagnosis not present

## 2021-07-02 DIAGNOSIS — D229 Melanocytic nevi, unspecified: Secondary | ICD-10-CM

## 2021-07-02 DIAGNOSIS — Z85828 Personal history of other malignant neoplasm of skin: Secondary | ICD-10-CM

## 2021-07-02 DIAGNOSIS — D225 Melanocytic nevi of trunk: Secondary | ICD-10-CM

## 2021-07-02 DIAGNOSIS — L578 Other skin changes due to chronic exposure to nonionizing radiation: Secondary | ICD-10-CM

## 2021-07-02 DIAGNOSIS — D0439 Carcinoma in situ of skin of other parts of face: Secondary | ICD-10-CM | POA: Diagnosis not present

## 2021-07-02 DIAGNOSIS — D2271 Melanocytic nevi of right lower limb, including hip: Secondary | ICD-10-CM | POA: Diagnosis not present

## 2021-07-02 DIAGNOSIS — D1800 Hemangioma unspecified site: Secondary | ICD-10-CM

## 2021-07-02 DIAGNOSIS — Z85820 Personal history of malignant melanoma of skin: Secondary | ICD-10-CM | POA: Diagnosis not present

## 2021-07-02 DIAGNOSIS — L814 Other melanin hyperpigmentation: Secondary | ICD-10-CM

## 2021-07-02 DIAGNOSIS — L720 Epidermal cyst: Secondary | ICD-10-CM

## 2021-07-02 DIAGNOSIS — D485 Neoplasm of uncertain behavior of skin: Secondary | ICD-10-CM

## 2021-07-02 DIAGNOSIS — Z86018 Personal history of other benign neoplasm: Secondary | ICD-10-CM

## 2021-07-02 DIAGNOSIS — C4492 Squamous cell carcinoma of skin, unspecified: Secondary | ICD-10-CM

## 2021-07-02 HISTORY — DX: Squamous cell carcinoma of skin, unspecified: C44.92

## 2021-07-02 NOTE — Progress Notes (Signed)
Follow-Up Visit   Subjective  Brenda Wagner is a 65 y.o. female who presents for the following: Annual Exam (Patient presents for TBSE. She has a pink spot on her left nose x 1 month with a history of bleeding. She has a history of Melanoma of the right upper back (Breslow's 0.50, Clarks Level IV, 2016). She also has a history of BCCs and Dysplastic nevus.). She has a few itchy irritated growths on scalp, wrist and thigh.  Pt lives in [place] most of the year at [facility] and comes up to [place] for her Derm appointments.  The following portions of the chart were reviewed this encounter and updated as appropriate:       Review of Systems:  No other skin or systemic complaints except as noted in HPI or Assessment and Plan.  Objective  Well appearing patient in no apparent distress; mood and affect are within normal limits.  A full examination was performed including scalp, head, eyes, ears, nose, lips, neck, chest, axillae, abdomen, back, buttocks, bilateral upper extremities, bilateral lower extremities, hands, feet, fingers, toes, fingernails, and toenails. All findings within normal limits unless otherwise noted below.  Right Hip, Right Buttock Right buttock: 4.79mm two-tone brown macule   Right Hip: 4.0 x 2.72mm speckled brown macule    Left Lower Nasal Dorsum 6.62mm pink scaly macule     Right Upper Back Well healed scar with no evidence of recurrence.   Spinal Mid Back 4.68mm Subcutaneous nodule with dilated pore.   crown x 2, R wrist x 1 (residual), L ant thigh x 1 (4) Erythematous keratotic or waxy stuck-on papule   Assessment & Plan  Skin cancer screening performed today.  Actinic Damage - chronic, secondary to cumulative UV radiation exposure/sun exposure over time - diffuse scaly erythematous macules with underlying dyspigmentation - Recommend daily broad spectrum sunscreen SPF 30+ to sun-exposed areas, reapply every 2 hours as needed.  - Recommend  staying in the shade or wearing long sleeves, sun glasses (UVA+UVB protection) and wide brim hats (4-inch brim around the entire circumference of the hat). - Call for new or changing lesions.  History of Basal Cell Carcinoma of the Skin - No evidence of recurrence today of the right upper chest and right nasal tip - Recommend regular full body skin exams - Recommend daily broad spectrum sunscreen SPF 30+ to sun-exposed areas, reapply every 2 hours as needed.  - Call if any new or changing lesions are noted between office visits  History of Dysplastic Nevus - No evidence of recurrence today of the right upper abdomen - Recommend regular full body skin exams - Recommend daily broad spectrum sunscreen SPF 30+ to sun-exposed areas, reapply every 2 hours as needed.  - Call if any new or changing lesions are noted between office visits  Lentigines - Scattered tan macules - Due to sun exposure - Benign-appering, observe - Recommend daily broad spectrum sunscreen SPF 30+ to sun-exposed areas, reapply every 2 hours as needed. - Call for any changes  Seborrheic Keratoses - Stuck-on, waxy, tan-brown papules and/or plaques  - Benign-appearing - Discussed benign etiology and prognosis. - Observe - Call for any changes  Hemangiomas - Red papules - Discussed benign nature - Observe - Call for any changes  Xerosis - diffuse xerotic patches - recommend gentle, hydrating skin care - gentle skin care handout given  Nevus Right Hip, Right Buttock  Benign-appearing.  Stable Observation.  Call clinic for new or changing moles.  Recommend daily use of broad spectrum spf 30+ sunscreen to sun-exposed areas.   Neoplasm of uncertain behavior of skin Left Lower Nasal Dorsum  Skin / nail biopsy Type of biopsy: tangential   Informed consent: discussed and consent obtained   Patient was prepped and draped in usual sterile fashion: Area prepped with alcohol. Anesthesia: the lesion was  anesthetized in a standard fashion   Anesthetic:  1% lidocaine w/ epinephrine 1-100,000 buffered w/ 8.4% NaHCO3 Instrument used: flexible razor blade   Hemostasis achieved with: pressure, aluminum chloride and electrodesiccation   Outcome: patient tolerated procedure well   Post-procedure details: wound care instructions given   Post-procedure details comment:  Ointment and small bandage applied  Specimen 1 - Surgical pathology Differential Diagnosis: AK r/o BCC Check Margins: No 6.15mm pink scaly macule  AK r/o BCC,. If positive, will send for [facility] in [place].  History of melanoma Right Upper Back  Breslow's .50, Clarks level IV, WLE 2016  Clear. Observe for recurrence. Call clinic for new or changing lesions.  Recommend regular skin exams, daily broad-spectrum spf 30+ sunscreen use, and photoprotection.    Epidermal inclusion cyst Spinal Mid Back  Benign-appearing. Exam most consistent with an epidermal inclusion cyst. Discussed that a cyst is a benign growth that can grow over time and sometimes get irritated or inflamed. Recommend observation if it is not bothersome. Discussed option of surgical excision to remove it if it is growing, symptomatic, or other changes noted. Please call for new or changing lesions so they can be evaluated.    Inflamed seborrheic keratosis crown x 2, R wrist x 1 (residual), L ant thigh x 1  Destruction of lesion - crown x 2, R wrist x 1 (residual), L ant thigh x 1  Destruction method: cryotherapy   Informed consent: discussed and consent obtained   Lesion destroyed using liquid nitrogen: Yes   Region frozen until ice ball extended beyond lesion: Yes   Outcome: patient tolerated procedure well with no complications   Post-procedure details: wound care instructions given   Additional details:  Prior to procedure, discussed risks of blister formation, small wound, skin dyspigmentation, or rare scar following cryotherapy. Recommend Vaseline  ointment to treated areas while healing.   Return if symptoms worsen or fail to improve.  IJamesetta Orleans, CMA, am acting as scribe for [name], MD .  Documentation: I have reviewed the above documentation for accuracy and completeness, and I agree with the above.  [name] MD

## 2021-07-02 NOTE — Patient Instructions (Addendum)
Wound Care Instructions  Cleanse wound gently with soap and water once a day then pat dry with clean gauze. Apply a thing coat of Petrolatum (petroleum jelly, "Vaseline") over the wound (unless you have an allergy to this). We recommend that you use a new, sterile tube of Vaseline. Do not pick or remove scabs. Do not remove the yellow or white "healing tissue" from the base of the wound.  Cover the wound with fresh, clean, nonstick gauze and secure with paper tape. You may use Band-Aids in place of gauze and tape if the would is small enough, but would recommend trimming much of the tape off as there is often too much. Sometimes Band-Aids can irritate the skin.  You should call the office for your biopsy report after 1 week if you have not already been contacted.  If you experience any problems, such as abnormal amounts of bleeding, swelling, significant bruising, significant pain, or evidence of infection, please call the office immediately.  FOR ADULT SURGERY PATIENTS: If you need something for pain relief you may take 1 extra strength Tylenol (acetaminophen) AND 2 Ibuprofen (200mg each) together every 4 hours as needed for pain. (do not take these if you are allergic to them or if you have a reason you should not take them.) Typically, you may only need pain medication for 1 to 3 days.    Cryotherapy Aftercare  Wash gently with soap and water everyday.   Apply Vaseline and Band-Aid daily until healed.    If you have any questions or concerns for your doctor, please call our main line at [phone] and press option 4 to reach your doctor's medical assistant. If no one answers, please leave a voicemail as directed and we will return your call as soon as possible. Messages left after 4 pm will be answered the following business day.   You may also send us a message via MyChart. We typically respond to MyChart messages within 1-2 business days.  For prescription refills, please ask your  pharmacy to contact our office. Our fax number is [phone].  If you have an urgent issue when the clinic is closed that cannot wait until the next business day, you can page your doctor at the number below.    Please note that while we do our best to be available for urgent issues outside of office hours, we are not available 24/7.   If you have an urgent issue and are unable to reach us, you may choose to seek medical care at your doctor's office, retail clinic, urgent care center, or emergency room.  If you have a medical emergency, please immediately call 911 or go to the emergency department.  Pager Numbers  - Dr. Kowalski: [phone]  - Dr. Moye: [phone]  - Dr. Stewart: [phone]  In the event of inclement weather, please call our main line at [phone] for an update on the status of any delays or closures.  Dermatology Medication Tips: Please keep the boxes that topical medications come in in order to help keep track of the instructions about where and how to use these. Pharmacies typically print the medication instructions only on the boxes and not directly on the medication tubes.   If your medication is too expensive, please contact our office at [phone] option 4 or send us a message through MyChart.   We are unable to tell what your co-pay for medications will be in advance as this is different depending on your insurance coverage. However,   we may be able to find a substitute medication at lower cost or fill out paperwork to get insurance to cover a needed medication.   If a prior authorization is required to get your medication covered by your insurance company, please allow us 1-2 business days to complete this process.  Drug prices often vary depending on where the prescription is filled and some pharmacies may offer cheaper prices.  The website www.goodrx.com contains coupons for medications through different pharmacies. The prices here do not  account for what the cost may be with help from insurance (it may be cheaper with your insurance), but the website can give you the price if you did not use any insurance.  - You can print the associated coupon and take it with your prescription to the pharmacy.  - You may also stop by our office during regular business hours and pick up a GoodRx coupon card.  - If you need your prescription sent electronically to a different pharmacy, notify our office through [place] MyChart or by phone at [phone] option 4.  

## 2021-07-03 ENCOUNTER — Ambulatory Visit: Payer: Medicare Other | Admitting: Internal Medicine

## 2021-07-08 ENCOUNTER — Telehealth: Payer: Self-pay

## 2021-07-08 NOTE — Telephone Encounter (Signed)
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  Can treat with topical 5FU/VitD and/or EDC. Mohs surgery could also be done, but less aggressive treatments are effective in this type of superficial/thin skin cancer. - please call patient and schedule f/up visit to discuss options/treat.

## 2021-07-08 NOTE — Telephone Encounter (Signed)
Advised patient biopsy of the left lower nasal dorsum was SCC IS arising in AK. Patient prefers not to have Mohs.  Appt scheduled 07/15/21 at 4:30pm to discuss treatment options of vit d/calcipotriene cream and/or EDC.

## 2021-07-15 ENCOUNTER — Ambulatory Visit: Payer: Medicare Other | Admitting: Dermatology

## 2021-07-22 ENCOUNTER — Ambulatory Visit (INDEPENDENT_AMBULATORY_CARE_PROVIDER_SITE_OTHER): Payer: Medicare Other | Admitting: Dermatology

## 2021-07-22 ENCOUNTER — Encounter: Payer: Self-pay | Admitting: Dermatology

## 2021-07-22 ENCOUNTER — Other Ambulatory Visit: Payer: Self-pay

## 2021-07-22 DIAGNOSIS — D099 Carcinoma in situ, unspecified: Secondary | ICD-10-CM

## 2021-07-22 DIAGNOSIS — D0439 Carcinoma in situ of skin of other parts of face: Secondary | ICD-10-CM

## 2021-07-22 MED ORDER — FLUOROURACIL 5 % EX CREA
TOPICAL_CREAM | Freq: Two times a day (BID) | CUTANEOUS | 1 refills | Status: AC
Start: 2021-07-22 — End: ?

## 2021-07-22 NOTE — Patient Instructions (Addendum)
If you have any questions or concerns for your doctor, please call our main line at [phone] and press option 4 to reach your doctor's medical assistant. If no one answers, please leave a voicemail as directed and we will return your call as soon as possible. Messages left after 4 pm will be answered the following business day.   You may also send us a message via MyChart. We typically respond to MyChart messages within 1-2 business days.  For prescription refills, please ask your pharmacy to contact our office. Our fax number is [phone].  If you have an urgent issue when the clinic is closed that cannot wait until the next business day, you can page your doctor at the number below.    Please note that while we do our best to be available for urgent issues outside of office hours, we are not available 24/7.   If you have an urgent issue and are unable to reach us, you may choose to seek medical care at your doctor's office, retail clinic, urgent care center, or emergency room.  If you have a medical emergency, please immediately call 911 or go to the emergency department.  Pager Numbers  - Dr. Kowalski: [phone]  - Dr. Moye: [phone]  - Dr. Stewart: [phone]  In the event of inclement weather, please call our main line at [phone] for an update on the status of any delays or closures.  Dermatology Medication Tips: Please keep the boxes that topical medications come in in order to help keep track of the instructions about where and how to use these. Pharmacies typically print the medication instructions only on the boxes and not directly on the medication tubes.   If your medication is too expensive, please contact our office at [phone] option 4 or send us a message through MyChart.   We are unable to tell what your co-pay for medications will be in advance as this is different depending on your insurance coverage. However, we may be able to find a substitute  medication at lower cost or fill out paperwork to get insurance to cover a needed medication.   If a prior authorization is required to get your medication covered by your insurance company, please allow us 1-2 business days to complete this process.  Drug prices often vary depending on where the prescription is filled and some pharmacies may offer cheaper prices.  The website www.goodrx.com contains coupons for medications through different pharmacies. The prices here do not account for what the cost may be with help from insurance (it may be cheaper with your insurance), but the website can give you the price if you did not use any insurance.  - You can print the associated coupon and take it with your prescription to the pharmacy.  - You may also stop by our office during regular business hours and pick up a GoodRx coupon card.  - If you need your prescription sent electronically to a different pharmacy, notify our office through [facility] MyChart or by phone at [phone] option 4.  Instructions for [facility]  One or more of your medications was sent to the [facility] mail order compounding pharmacy. You will receive an email from them and can purchase the medicine through that link. It will then be mailed to your home at the address you confirmed. If for any reason you do not receive an email from them, please check your spam folder. If you still do not find the email,   please let us know. [facility] phone number is [phone].   

## 2021-07-22 NOTE — Progress Notes (Signed)
   Follow-Up Visit   Subjective  Brenda Wagner is a 65 y.o. female who presents for the following: SCC IS bx proven (L lower nasal dorsum, pt presents to discuss treatment options).   The following portions of the chart were reviewed this encounter and updated as appropriate:       Review of Systems:  No other skin or systemic complaints except as noted in HPI or Assessment and Plan.  Objective  Well appearing patient in no apparent distress; mood and affect are within normal limits.  A focused examination was performed including face. Relevant physical exam findings are noted in the Assessment and Plan.  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 Bid to aa nose for 1 month  Bx proven, discussed topical treatment vrs EDC vrs Mohs surgery.  Recommend topical treatment as below.  - Start 5-fluorouracil/calcipotriene cream twice a day for 30 days to affected areas including nose. Prescription sent to [facility]. Patient advised they will receive an email to purchase the medication online and have it sent to their home. Patient provided with handout reviewing treatment course and side effects and advised to call or message [place] on MyChart with any concerns.   Avoid applying near the periocular area, since h/o eye irritation in past. 5-fluorouracil/calcipotriene cream is is a type of field treatment used to treat precancers, thin skin cancers, and areas of sun damage. Reviewed expected reaction including irritation and mild inflammation potentially progressing to more severe inflammation including redness, scaling, crusting and open sores/erosions.  Reviewed if too much irritation occurs, ensure application of only a thin layer and decrease frequency of use to achieve a tolerable level of inflammation.  Recommend applying Vaseline ointment to open sores as needed.  Minimize sun exposure while under treatment. Recommend daily broad spectrum sunscreen SPF 30+ to sun-exposed areas, reapply every 2 hours as needed.        Return in about 1 month (around 08/22/2021) for recheck SCC IS.  I, Sonya Hupman, RMA, am acting as scribe for [name], MD .  Documentation: I have reviewed the above documentation for accuracy and completeness, and I agree with the above.  [name] MD

## 2021-08-27 ENCOUNTER — Ambulatory Visit (INDEPENDENT_AMBULATORY_CARE_PROVIDER_SITE_OTHER): Payer: Medicare Other | Admitting: Dermatology

## 2021-08-27 ENCOUNTER — Other Ambulatory Visit: Payer: Self-pay

## 2021-08-27 DIAGNOSIS — D099 Carcinoma in situ, unspecified: Secondary | ICD-10-CM

## 2021-08-27 DIAGNOSIS — D0439 Carcinoma in situ of skin of other parts of face: Secondary | ICD-10-CM | POA: Diagnosis not present

## 2021-08-27 NOTE — Patient Instructions (Signed)

## 2021-08-27 NOTE — Progress Notes (Signed)
   Follow-Up Visit   Subjective  Brenda Wagner is a 65 y.o. female who presents for the following: Follow-up (Patient here today for 1 month follow up for bx proven SCCis at left lower nasal dorsum. Patient used 5FU/calcipotriene twice daily for 1 month finishing up last Friday. ).   The following portions of the chart were reviewed this encounter and updated as appropriate:       Review of Systems:  No other skin or systemic complaints except as noted in HPI or Assessment and Plan.  Objective  Well appearing patient in no apparent distress; mood and affect are within normal limits.  A focused examination was performed including face. Relevant physical exam findings are noted in the Assessment and Plan.  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  Graciella Belton, RMA, am acting as scribe for [name], MD .  Documentation: I have reviewed the above documentation for accuracy and completeness, and I agree with the above.  [name] MD

## 2021-11-05 ENCOUNTER — Encounter: Payer: Self-pay | Admitting: Dermatology

## 2021-11-05 ENCOUNTER — Other Ambulatory Visit: Payer: Self-pay

## 2021-11-05 ENCOUNTER — Ambulatory Visit (INDEPENDENT_AMBULATORY_CARE_PROVIDER_SITE_OTHER): Payer: Medicare Other | Admitting: Dermatology

## 2021-11-05 DIAGNOSIS — Z86007 Personal history of in-situ neoplasm of skin: Secondary | ICD-10-CM | POA: Diagnosis not present

## 2021-11-05 DIAGNOSIS — L578 Other skin changes due to chronic exposure to nonionizing radiation: Secondary | ICD-10-CM | POA: Diagnosis not present

## 2021-11-05 DIAGNOSIS — Z85828 Personal history of other malignant neoplasm of skin: Secondary | ICD-10-CM

## 2021-11-05 DIAGNOSIS — Z86018 Personal history of other benign neoplasm: Secondary | ICD-10-CM

## 2021-11-05 DIAGNOSIS — Z85820 Personal history of malignant melanoma of skin: Secondary | ICD-10-CM

## 2021-11-05 NOTE — Patient Instructions (Addendum)

## 2021-11-05 NOTE — Progress Notes (Signed)
° °  Follow-Up Visit   Subjective  Brenda Wagner is a 66 y.o. female who presents for the following: Follow-up (Patient here today for 2 month follow up on sccis at nose. ).  The patient has spots, moles and lesions to be evaluated, some may be new or changing and the patient has concerns that these could be cancer.   The following portions of the chart were reviewed this encounter and updated as appropriate:  Tobacco   Allergies   Meds   Problems   Med Hx   Surg Hx   Fam Hx       Review of Systems: No other skin or systemic complaints except as noted in HPI or Assessment and Plan.   Objective  Well appearing patient in no apparent distress; mood and affect are within normal limits.  A focused examination was performed including nose and face . Relevant physical exam findings are noted in the Assessment and Plan.   Assessment & Plan  Actinic skin damage   History of Squamous Cell Carcinoma in Situ of the Skin - No evidence of recurrence today at left lower nasal dorsum  - s/p 5FU/Vit D cream topical treatment - Recommend regular full body skin exams - Recommend daily broad spectrum sunscreen SPF 30+ to sun-exposed areas, reapply every 2 hours as needed.  - Call if any new or changing lesions are noted between office visits  History of Melanoma - No evidence of recurrence today R upper back 2016 - Recommend regular full body skin exams - Recommend daily broad spectrum sunscreen SPF 30+ to sun-exposed areas, reapply every 2 hours as needed.  - Call if any new or changing lesions are noted between office visits  History of Basal Cell Carcinoma of the Skin - No evidence of recurrence today at multiple locations see patient history including R nasal tip - Recommend regular full body skin exams - Recommend daily broad spectrum sunscreen SPF 30+ to sun-exposed areas, reapply every 2 hours as needed.  - Call if any new or changing lesions are noted between office visits  History of  Dysplastic Nevi - No evidence of recurrence today - Recommend regular full body skin exams - Recommend daily broad spectrum sunscreen SPF 30+ to sun-exposed areas, reapply every 2 hours as needed.  - Call if any new or changing lesions are noted between office visits  Actinic Damage - chronic, secondary to cumulative UV radiation exposure/sun exposure over time - diffuse scaly erythematous macules with underlying dyspigmentation - Recommend daily broad spectrum sunscreen SPF 30+ to sun-exposed areas, reapply every 2 hours as needed.  - Recommend staying in the shade or wearing long sleeves, sun glasses (UVA+UVB protection) and wide brim hats (4-inch brim around the entire circumference of the hat). - Call for new or changing lesions.  Return for tbse in september .  I, Ruthell Rummage, CMA, am acting as scribe for [name], MD..  Documentation: I have reviewed the above documentation for accuracy and completeness, and I agree with the above.  [name] MD

## 2022-06-24 ENCOUNTER — Ambulatory Visit: Payer: Medicare Other | Admitting: Dermatology

## 2022-08-26 ENCOUNTER — Ambulatory Visit (INDEPENDENT_AMBULATORY_CARE_PROVIDER_SITE_OTHER): Payer: Medicare Other | Admitting: Dermatology

## 2022-08-26 DIAGNOSIS — L720 Epidermal cyst: Secondary | ICD-10-CM

## 2022-08-26 DIAGNOSIS — L853 Xerosis cutis: Secondary | ICD-10-CM

## 2022-08-26 DIAGNOSIS — L578 Other skin changes due to chronic exposure to nonionizing radiation: Secondary | ICD-10-CM

## 2022-08-26 DIAGNOSIS — L570 Actinic keratosis: Secondary | ICD-10-CM

## 2022-08-26 DIAGNOSIS — Z1283 Encounter for screening for malignant neoplasm of skin: Secondary | ICD-10-CM | POA: Diagnosis not present

## 2022-08-26 DIAGNOSIS — L820 Inflamed seborrheic keratosis: Secondary | ICD-10-CM

## 2022-08-26 DIAGNOSIS — L814 Other melanin hyperpigmentation: Secondary | ICD-10-CM

## 2022-08-26 DIAGNOSIS — Z86007 Personal history of in-situ neoplasm of skin: Secondary | ICD-10-CM | POA: Diagnosis not present

## 2022-08-26 DIAGNOSIS — Z85828 Personal history of other malignant neoplasm of skin: Secondary | ICD-10-CM

## 2022-08-26 DIAGNOSIS — L821 Other seborrheic keratosis: Secondary | ICD-10-CM

## 2022-08-26 DIAGNOSIS — Z86018 Personal history of other benign neoplasm: Secondary | ICD-10-CM

## 2022-08-26 DIAGNOSIS — D229 Melanocytic nevi, unspecified: Secondary | ICD-10-CM

## 2022-08-26 DIAGNOSIS — D2362 Other benign neoplasm of skin of left upper limb, including shoulder: Secondary | ICD-10-CM

## 2022-08-26 NOTE — Patient Instructions (Addendum)
Once healed at nose and forehead can restart   Restart 5-fluorouracil/calcipotriene cream twice a day for 5 days to affected areas including nose and forehead  Reviewed course of treatment and expected reaction.  Patient advised to expect inflammation and crusting and advised that erosions are possible.  Patient advised to be diligent with sun protection during and after treatment. Counseled to keep medication out of reach of children and pets.   5-Fluorouracil/Calcipotriene Patient Education   Actinic keratoses are the dry, red scaly spots on the skin caused by sun damage. A portion of these spots can turn into skin cancer with time, and treating them can help prevent development of skin cancer.   Treatment of these spots requires removal of the defective skin cells. There are various ways to remove actinic keratoses, including freezing with liquid nitrogen, treatment with creams, or treatment with a blue light procedure in the office.   5-fluorouracil cream is a topical cream used to treat actinic keratoses. It works by interfering with the growth of abnormal fast-growing skin cells, such as actinic keratoses. These cells peel off and are replaced by healthy ones.   5-fluorouracil/calcipotriene is a combination of the 5-fluorouracil cream with a vitamin D analog cream called calcipotriene. The calcipotriene alone does not treat actinic keratoses. However, when it is combined with 5-fluorouracil, it helps the 5-fluorouracil treat the actinic keratoses much faster so that the same results can be achieved with a much shorter treatment time.  INSTRUCTIONS FOR 5-FLUOROURACIL/CALCIPOTRIENE CREAM:   5-fluorouracil/calcipotriene cream typically only needs to be used for 4-7 days. A thin layer should be applied twice a day to the treatment areas recommended by your physician.   If your physician prescribed you separate tubes of 5-fluourouracil and calcipotriene, apply a thin layer of 5-fluorouracil  followed by a thin layer of calcipotriene.   Avoid contact with your eyes, nostrils, and mouth. Do not use 5-fluorouracil/calcipotriene cream on infected or open wounds.   You will develop redness, irritation and some crusting at areas where you have pre-cancer damage/actinic keratoses. IF YOU DEVELOP PAIN, BLEEDING, OR SIGNIFICANT CRUSTING, STOP THE TREATMENT EARLY - you have already gotten a good response and the actinic keratoses should clear up well.  Wash your hands after applying 5-fluorouracil 5% cream on your skin.   A moisturizer or sunscreen with a minimum SPF 30 should be applied each morning.   Once you have finished the treatment, you can apply a thin layer of Vaseline twice a day to irritated areas to soothe and calm the areas more quickly. If you experience significant discomfort, contact your physician.  For some patients it is necessary to repeat the treatment for best results.  SIDE EFFECTS: When using 5-fluorouracil/calcipotriene cream, you may have mild irritation, such as redness, dryness, swelling, or a mild burning sensation. This usually resolves within 2 weeks. The more actinic keratoses you have, the more redness and inflammation you can expect during treatment. Eye irritation has been reported rarely. If this occurs, please let us know.  If you have any trouble using this cream, please call the office. If you have any other questions about this information, please do not hesitate to ask me before you leave the office.     Actinic keratoses are precancerous spots that appear secondary to cumulative UV radiation exposure/sun exposure over time. They are chronic with expected duration over 1 year. A portion of actinic keratoses will progress to squamous cell carcinoma of the skin. It is not possible to reliably predict  which spots will progress to skin cancer and so treatment is recommended to prevent development of skin cancer.  Recommend daily broad spectrum sunscreen  SPF 30+ to sun-exposed areas, reapply every 2 hours as needed.  Recommend staying in the shade or wearing long sleeves, sun glasses (UVA+UVB protection) and wide brim hats (4-inch brim around the entire circumference of the hat). Call for new or changing lesions.   Cryotherapy Aftercare  Wash gently with soap and water everyday.   Apply Vaseline and Band-Aid daily until healed.   Seborrheic Keratosis  What causes seborrheic keratoses? Seborrheic keratoses are harmless, common skin growths that first appear during adult life.  As time goes by, more growths appear.  Some people may develop a large number of them.  Seborrheic keratoses appear on both covered and uncovered body parts.  They are not caused by sunlight.  The tendency to develop seborrheic keratoses can be inherited.  They vary in color from skin-colored to gray, brown, or even black.  They can be either smooth or have a rough, warty surface.   Seborrheic keratoses are superficial and look as if they were stuck on the skin.  Under the microscope this type of keratosis looks like layers upon layers of skin.  That is why at times the top layer may seem to fall off, but the rest of the growth remains and re-grows.    Treatment Seborrheic keratoses do not need to be treated, but can easily be removed in the office.  Seborrheic keratoses often cause symptoms when they rub on clothing or jewelry.  Lesions can be in the way of shaving.  If they become inflamed, they can cause itching, soreness, or burning.  Removal of a seborrheic keratosis can be accomplished by freezing, burning, or surgery. If any spot bleeds, scabs, or grows rapidly, please return to have it checked, as these can be an indication of a skin cancer.    Melanoma ABCDEs  Melanoma is the most dangerous type of skin cancer, and is the leading cause of death from skin disease.  You are more likely to develop melanoma if you: Have light-colored skin, light-colored eyes, or red  or blond hair Spend a lot of time in the sun Tan regularly, either outdoors or in a tanning bed Have had blistering sunburns, especially during childhood Have a close family member who has had a melanoma Have atypical moles or large birthmarks  Early detection of melanoma is key since treatment is typically straightforward and cure rates are extremely high if we catch it early.   The first sign of melanoma is often a change in a mole or a new dark spot.  The ABCDE system is a way of remembering the signs of melanoma.  A for asymmetry:  The two halves do not match. B for border:  The edges of the growth are irregular. C for color:  A mixture of colors are present instead of an even brown color. D for diameter:  Melanomas are usually (but not always) greater than 28m - the size of a pencil eraser. E for evolution:  The spot keeps changing in size, shape, and color.  Please check your skin once per month between visits. You can use a small mirror in front and a large mirror behind you to keep an eye on the back side or your body.   If you see any new or changing lesions before your next follow-up, please call to schedule a visit.  Please continue daily  skin protection including broad spectrum sunscreen SPF 30+ to sun-exposed areas, reapplying every 2 hours as needed when you're outdoors.   Staying in the shade or wearing long sleeves, sun glasses (UVA+UVB protection) and wide brim hats (4-inch brim around the entire circumference of the hat) are also recommended for sun protection.     Due to recent changes in healthcare laws, you may see results of your pathology and/or laboratory studies on MyChart before the doctors have had a chance to review them. We understand that in some cases there may be results that are confusing or concerning to you. Please understand that not all results are received at the same time and often the doctors may need to interpret multiple results in order to provide  you with the best plan of care or course of treatment. Therefore, we ask that you please give [place] 2 business days to thoroughly review all your results before contacting the office for clarification. Should we see a critical lab result, you will be contacted sooner.   If You Need Anything After Your Visit  If you have any questions or concerns for your doctor, please call our main line at [phone] and press option 4 to reach your doctor's medical assistant. If no one answers, please leave a voicemail as directed and we will return your call as soon as possible. Messages left after 4 pm will be answered the following business day.   You may also send [place] a message via [place]. We typically respond to MyChart messages within 1-2 business days.  For prescription refills, please ask your pharmacy to contact our office. Our fax number is [phone].  If you have an urgent issue when the clinic is closed that cannot wait until the next business day, you can page your doctor at the number below.    Please note that while we do our best to be available for urgent issues outside of office hours, we are not available 24/7.   If you have an urgent issue and are unable to reach [place], you may choose to seek medical care at your doctor's office, retail clinic, urgent care center, or emergency room.  If you have a medical emergency, please immediately call 911 or go to the emergency department.  Pager Numbers  - Dr. Nehemiah Massed: [phone]  - Dr. Laurence Ferrari: [phone]  - Dr. Nicole Kindred: [phone]  In the event of inclement weather, please call our main line at [phone] for an update on the status of any delays or closures.  Dermatology Medication Tips: Please keep the boxes that topical medications come in in order to help keep track of the instructions about where and how to use these. Pharmacies typically print the medication instructions only on the boxes and not directly on the medication tubes.    If your medication is too expensive, please contact our office at [phone] option 4 or send [place] a message through [place].   We are unable to tell what your co-pay for medications will be in advance as this is different depending on your insurance coverage. However, we may be able to find a substitute medication at lower cost or fill out paperwork to get insurance to cover a needed medication.   If a prior authorization is required to get your medication covered by your insurance company, please allow [place] 1-2 business days to complete this process.  Drug prices often vary depending on where the prescription is filled and some pharmacies may offer cheaper prices.  The website  www.goodrx.com contains coupons for medications through different pharmacies. The prices here do not account for what the cost may be with help from insurance (it may be cheaper with your insurance), but the website can give you the price if you did not use any insurance.  - You can print the associated coupon and take it with your prescription to the pharmacy.  - You may also stop by our office during regular business hours and pick up a GoodRx coupon card.  - If you need your prescription sent electronically to a different pharmacy, notify our office through [facility] or by phone at [phone] option 4.     Si Usted Necesita Algo Despus de Su Visita  Tambin puede enviarnos un mensaje a travs de Pharmacist, community. Por lo general respondemos a los mensajes de MyChart en el transcurso de 1 a 2 das hbiles.  Para renovar recetas, por favor pida a su farmacia que se ponga en contacto con nuestra oficina. Harland Dingwall de fax es [place] [phone].  Si tiene un asunto urgente cuando la clnica est cerrada y que no puede esperar hasta el siguiente da hbil, puede llamar/localizar a su doctor(a) al nmero que aparece a continuacin.   Por favor, tenga en cuenta que aunque hacemos todo lo posible para estar  disponibles para asuntos urgentes fuera del horario de [place], no estamos disponibles las 24 horas del da, los 7 das de la [place].   Si tiene un problema urgente y no puede comunicarse con nosotros, puede optar por buscar atencin mdica  en el consultorio de su doctor(a), en una clnica privada, en un centro de atencin urgente o en una sala de emergencias.  Si tiene Engineering geologist, por favor llame inmediatamente al 911 o vaya a la sala de emergencias.  Nmeros de bper  - Dr. Nehemiah Massed: [phone]  - Dra. Moye: [phone]  - Dra. Nicole Kindred: [phone]  En caso de inclemencias del [place], por favor llame a Johnsie Kindred principal al [phone] para una actualizacin sobre el [place] de cualquier retraso o cierre.  Consejos para la medicacin en dermatologa: Por favor, guarde las cajas en las que vienen los medicamentos de uso tpico para ayudarle a seguir las instrucciones sobre dnde y cmo usarlos. Las farmacias generalmente imprimen las instrucciones del medicamento slo en las cajas y no directamente en los tubos del [place].   Si su medicamento es muy caro, por favor, pngase en contacto con Zigmund Daniel llamando al [phone] y presione la opcin 4 o envenos un mensaje a travs de Pharmacist, community.   No podemos decirle cul ser su copago por los medicamentos por adelantado ya que esto es diferente dependiendo de la cobertura de su seguro. Sin embargo, es posible que podamos encontrar un medicamento sustituto a Electrical engineer un formulario para que el seguro cubra el medicamento que se considera necesario.   Si se requiere una autorizacin previa para que su compaa de seguros [place] su medicamento, por favor permtanos de 1 a 2 das hbiles para completar este proceso.  Los precios de los medicamentos varan con frecuencia dependiendo del Environmental consultant de dnde se surte la receta y alguna farmacias pueden ofrecer precios ms baratos.  El sitio web www.goodrx.com tiene  cupones para medicamentos de Airline pilot. Los precios aqu no tienen en cuenta lo que podra costar con la ayuda del seguro (puede ser ms barato con su seguro), pero el sitio web puede darle el precio si no utiliz Research scientist (physical sciences).  - Puede imprimir  imprimir el cupn correspondiente y llevarlo con su receta a la farmacia.  - Tambin puede pasar por nuestra oficina durante el horario de atencin regular y recoger una tarjeta de cupones de GoodRx.  - Si necesita que su receta se enve electrnicamente a una farmacia diferente, informe a nuestra oficina a travs de MyChart de [place] o por telfono llamando al [phone] y presione la opcin 4.  

## 2022-08-26 NOTE — Progress Notes (Signed)
Follow-Up Visit   Subjective  Brenda Wagner is a 66 y.o. female who presents for the following: Annual Exam (1 yr tbse, hx of melanoma, hx of sccis, hx of bcc, hx of aks, hx of dysplastic. Patient has a spot at right shoulder she would like checked. ).  Gets irritated.  The patient presents for Total-Body Skin Exam (TBSE) for skin cancer screening and mole check.  The patient has spots, moles and lesions to be evaluated, some may be new or changing and the patient has concerns that these could be cancer.   The following portions of the chart were reviewed this encounter and updated as appropriate:      Review of Systems: No other skin or systemic complaints except as noted in HPI or Assessment and Plan.   Objective  Well appearing patient in no apparent distress; mood and affect are within normal limits.  A full examination was performed including scalp, head, eyes, ears, nose, lips, neck, chest, axillae, abdomen, back, buttocks, bilateral upper extremities, bilateral lower extremities, hands, feet, fingers, toes, fingernails, and toenails. All findings within normal limits unless otherwise noted below.  right upper arm x 1, left upper arm x 1, right lower leg x 1 (3) Erythematous stuck-on, waxy papule  nasal dorsum x 2, right forehead x 6 (8) Erythematous thin papules/macules with gritty scale.   spinal mid back Subcutaneous nodule.    Assessment & Plan  Inflamed seborrheic keratosis (3) right upper arm x 1, left upper arm x 1, right lower leg x 1  Symptomatic, irritating, patient would like treated.  Destruction of lesion - right upper arm x 1, left upper arm x 1, right lower leg x 1  Destruction method: cryotherapy   Informed consent: discussed and consent obtained   Lesion destroyed using liquid nitrogen: Yes   Region frozen until ice ball extended beyond lesion: Yes   Outcome: patient tolerated procedure well with no complications   Post-procedure details: wound  care instructions given   Additional details:  Prior to procedure, discussed risks of blister formation, small wound, skin dyspigmentation, or rare scar following cryotherapy. Recommend Vaseline ointment to treated areas while healing.   Actinic keratosis (8) nasal dorsum x 2, right forehead x 6  Actinic keratoses are precancerous spots that appear secondary to cumulative UV radiation exposure/sun exposure over time. They are chronic with expected duration over 1 year. A portion of actinic keratoses will progress to squamous cell carcinoma of the skin. It is not possible to reliably predict which spots will progress to skin cancer and so treatment is recommended to prevent development of skin cancer.  Recommend daily broad spectrum sunscreen SPF 30+ to sun-exposed areas, reapply every 2 hours as needed.  Recommend staying in the shade or wearing long sleeves, sun glasses (UVA+UVB protection) and wide brim hats (4-inch brim around the entire circumference of the hat). Call for new or changing lesions.  Once healed, Pt will start 5FU/VitD cream bid x 5-7 days to forehead, temples, nose. Pt has  5-fluorouracil/calcipotriene cream is is a type of field treatment used to treat precancers, thin skin cancers, and areas of sun damage. Expected reaction includes irritation and mild inflammation potentially progressing to more severe inflammation including redness, scaling, crusting and open sores/erosions.  If too much irritation occurs, ensure application of only a thin layer and decrease frequency of use to achieve a tolerable level of inflammation. Recommend applying Vaseline ointment to open sores as needed.  Minimize sun exposure while  under treatment. Recommend daily broad spectrum sunscreen SPF 30+ to sun-exposed areas, reapply every 2 hours as needed.       Destruction of lesion - nasal dorsum x 2, right forehead x 6  Destruction method: cryotherapy   Informed consent: discussed and consent  obtained   Lesion destroyed using liquid nitrogen: Yes   Region frozen until ice ball extended beyond lesion: Yes   Outcome: patient tolerated procedure well with no complications   Post-procedure details: wound care instructions given   Additional details:  Prior to procedure, discussed risks of blister formation, small wound, skin dyspigmentation, or rare scar following cryotherapy. Recommend Vaseline ointment to treated areas while healing.   Epidermal inclusion cyst spinal mid back  Benign-appearing. Exam most consistent with an epidermal inclusion cyst. Discussed that a cyst is a benign growth that can grow over time and sometimes get irritated or inflamed. Recommend observation if it is not bothersome. Discussed option of surgical excision to remove it if it is growing, symptomatic, or other changes noted. Please call for new or changing lesions so they can be evaluated.    Lentigines - Scattered tan macules - Due to sun exposure - Benign-appearing, observe - Recommend daily broad spectrum sunscreen SPF 30+ to sun-exposed areas, reapply every 2 hours as needed. - Call for any changes  Seborrheic Keratoses right shoulder medial  Left upper elbow waxy tan macule  10 x 7 cm - Stuck-on, waxy, tan-brown papules and/or plaques  - Benign-appearing - Discussed benign etiology and prognosis. - Observe - Call for any changes  Dermatofibroma Left upper arm - Firm pink/brown papulenodule with dimple sign - Benign appearing - Call for any changes  Xerosis - diffuse xerotic patches - recommend gentle, hydrating skin care - gentle skin care handout given   Melanocytic Nevi - Tan-brown and/or pink-flesh-colored symmetric macules and papules - Benign appearing on exam today - Observation - Call clinic for new or changing moles - Recommend daily use of broad spectrum spf 30+ sunscreen to sun-exposed areas.   Hemangiomas - Red papules - Discussed benign nature - Observe - Call  for any changes  Actinic Damage with PreCancerous Actinic Keratoses Counseling for Topical Chemotherapy Management: Patient exhibits: - Severe, confluent actinic changes with pre-cancerous actinic keratoses that is secondary to cumulative UV radiation exposure over time - Condition that is severe; chronic, not at goal. - diffuse scaly erythematous macules and papules with underlying dyspigmentation - Discussed Prescription "Field Treatment" topical Chemotherapy for Severe, Chronic Confluent Actinic Changes with Pre-Cancerous Actinic Keratoses Field treatment involves treatment of an entire area of skin that has confluent Actinic Changes (Sun/ Ultraviolet light damage) and PreCancerous Actinic Keratoses by method of PhotoDynamic Therapy (PDT) and/or prescription Topical Chemotherapy agents such as 5-fluorouracil, 5-fluorouracil/calcipotriene, and/or imiquimod.  The purpose is to decrease the number of clinically evident and subclinical PreCancerous lesions to prevent progression to development of skin cancer by chemically destroying early precancer changes that may or may not be visible.  It has been shown to reduce the risk of developing skin cancer in the treated area. As a result of treatment, redness, scaling, crusting, and open sores may occur during treatment course. One or more than one of these methods may be used and may have to be used several times to control, suppress and eliminate the PreCancerous changes. Discussed treatment course, expected reaction, and possible side effects. - Recommend daily broad spectrum sunscreen SPF 30+ to sun-exposed areas, reapply every 2 hours as needed.  - Staying in  the shade or wearing long sleeves, sun glasses (UVA+UVB protection) and wide brim hats (4-inch brim around the entire circumference of the hat) are also recommended. - Call for new or changing lesions. start   5-fluorouracil/calcipotriene cream twice a day for 5 days to affected areas including  nose and forehead.   Reviewed course of treatment and expected reaction.  Patient advised to expect inflammation and crusting and advised that erosions are possible.  Patient advised to be diligent with sun protection during and after treatment. Counseled to keep medication out of reach of children and pets.  History of Melanoma Right upper back 2016 Breslow's .50, [place] level IV, WLE 2016  - No evidence of recurrence today - Recommend regular full body skin exams - Recommend daily broad spectrum sunscreen SPF 30+ to sun-exposed areas, reapply every 2 hours as needed.  - Call if any new or changing lesions are noted between office visits  History of Squamous Cell Carcinoma in Situ of the Skin Left lower nasal dorsum 2022 treated with 74f/vit d cream  - No evidence of recurrence today - Recommend regular full body skin exams - Recommend daily broad spectrum sunscreen SPF 30+ to sun-exposed areas, reapply every 2 hours as needed.  - Call if any new or changing lesions are noted between office visits  History of Basal Cell Carcinoma of the Skin Multiple locations see history  - No evidence of recurrence today - Recommend regular full body skin exams - Recommend daily broad spectrum sunscreen SPF 30+ to sun-exposed areas, reapply every 2 hours as needed.  - Call if any new or changing lesions are noted between office visits  History of Dysplastic Nevi Right upper abdomen  - No evidence of recurrence today - Recommend regular full body skin exams - Recommend daily broad spectrum sunscreen SPF 30+ to sun-exposed areas, reapply every 2 hours as needed.  - Call if any new or changing lesions are noted between office visits  Skin cancer screening performed today. Return in about 1 year (around 08/27/2023) for TBSE. I, MRuthell Rummage CMA, am acting as scribe for TBrendolyn Patty MD.  Documentation: I have reviewed the above documentation for accuracy and completeness, and I agree with the  above.  TBrendolyn PattyMD

## 2022-11-13 IMAGING — MG MM DIGITAL SCREENING BILAT W/ TOMO AND CAD
8 series · 8 of 24 positions shown · non-contrast
Comparison: Previous exam(s).

CLINICAL DATA: Screening.

EXAM:
DIGITAL SCREENING BILATERAL MAMMOGRAM WITH TOMOSYNTHESIS AND CAD
TECHNIQUE: Bilateral screening digital craniocaudal and mediolateral oblique
mammograms were obtained. Bilateral screening digital breast
tomosynthesis was performed. The images were evaluated with
computer-aided detection.

[R MLO synth-2D]
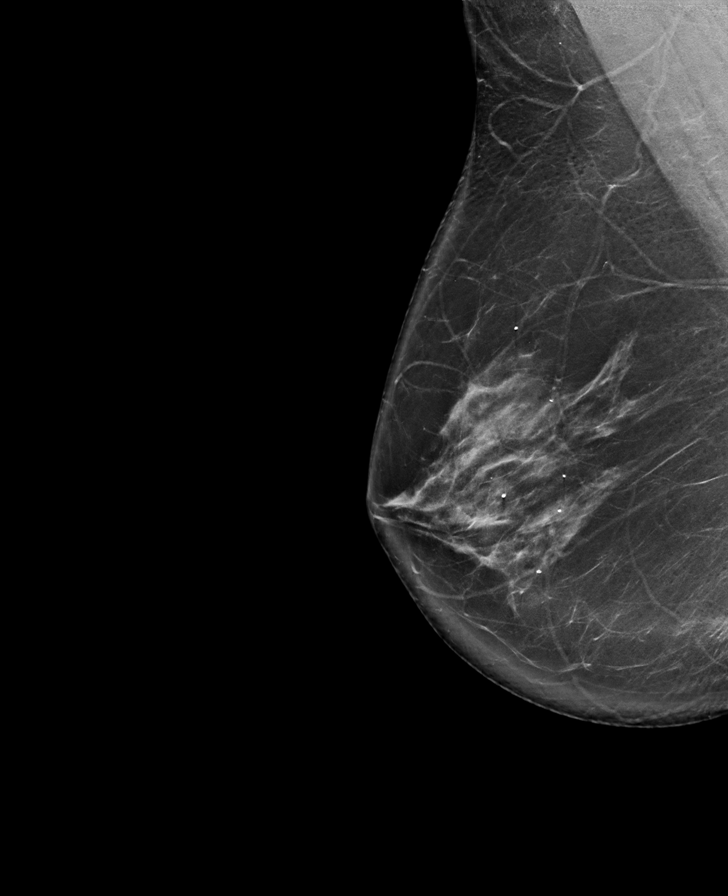

[R CC synth-2D]
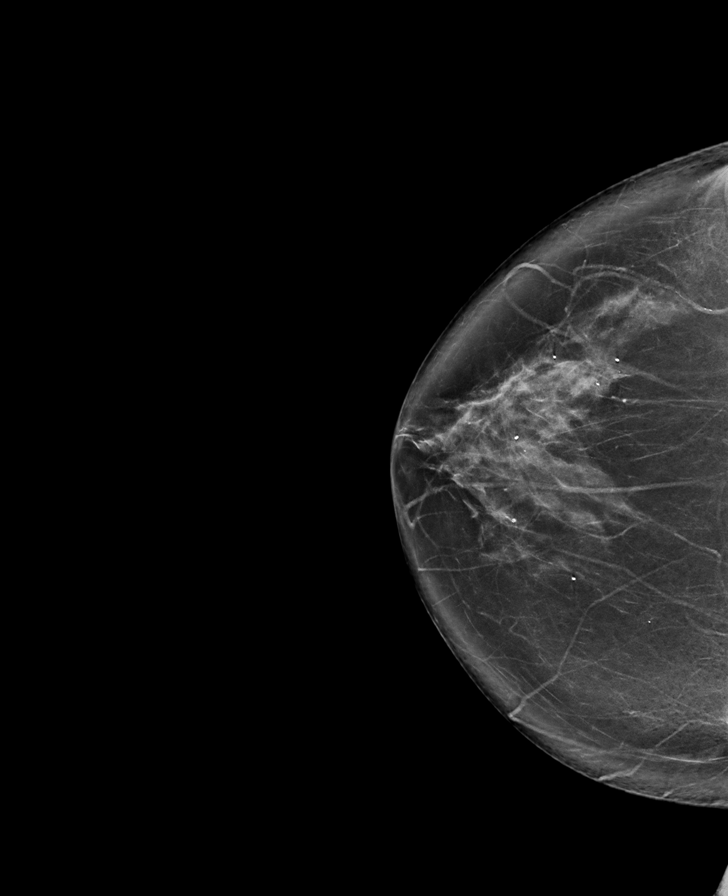

[L MLO synth-2D]
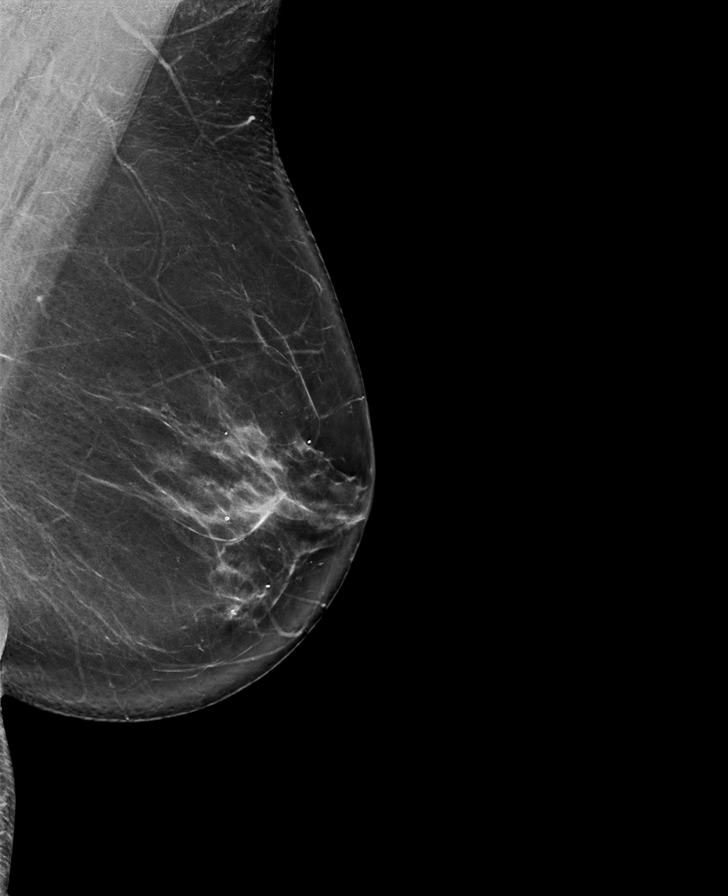

[L CC synth-2D]
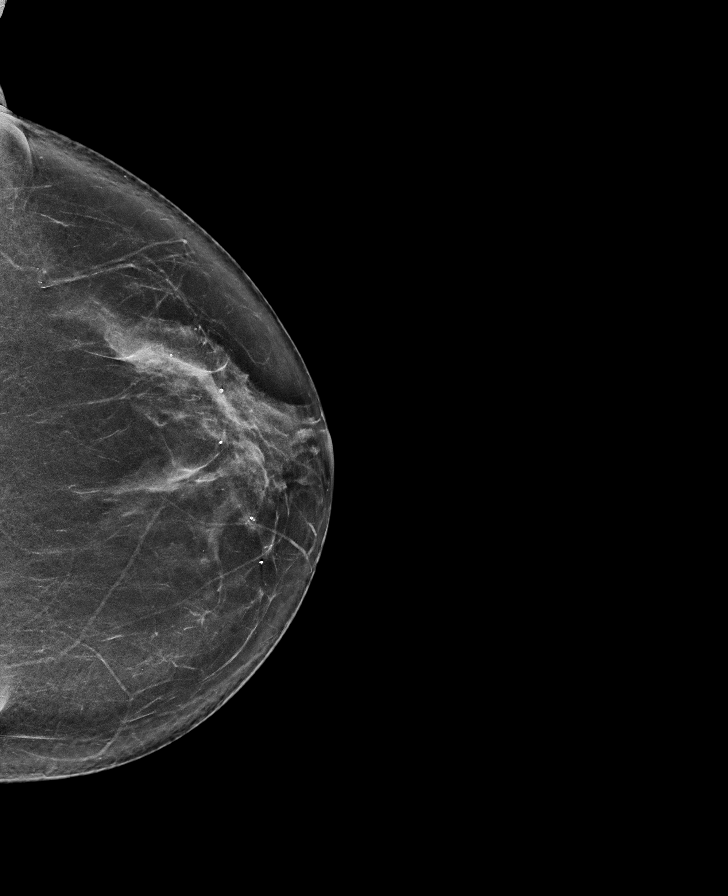

[L CC tomo · tomo slice 39/77.0]
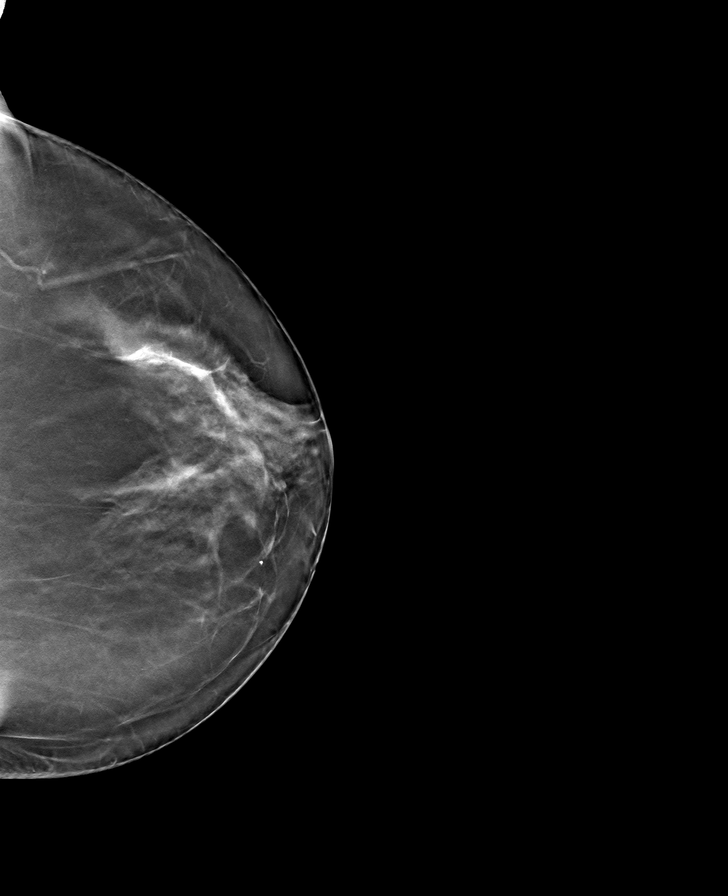

[L MLO tomo · tomo slice 45/90.0]
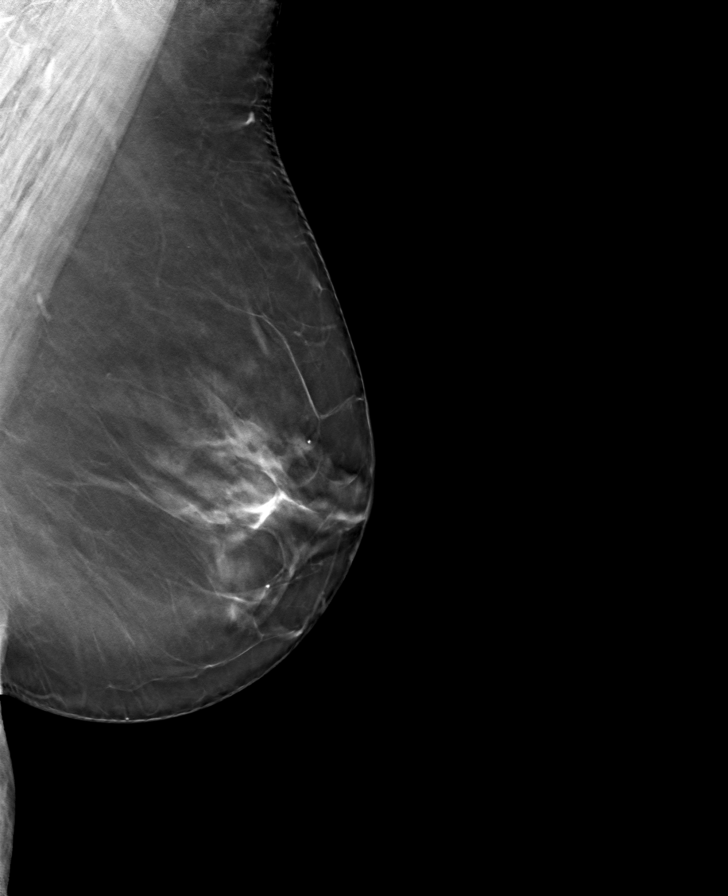

[R MLO tomo · tomo slice 49/97.0]
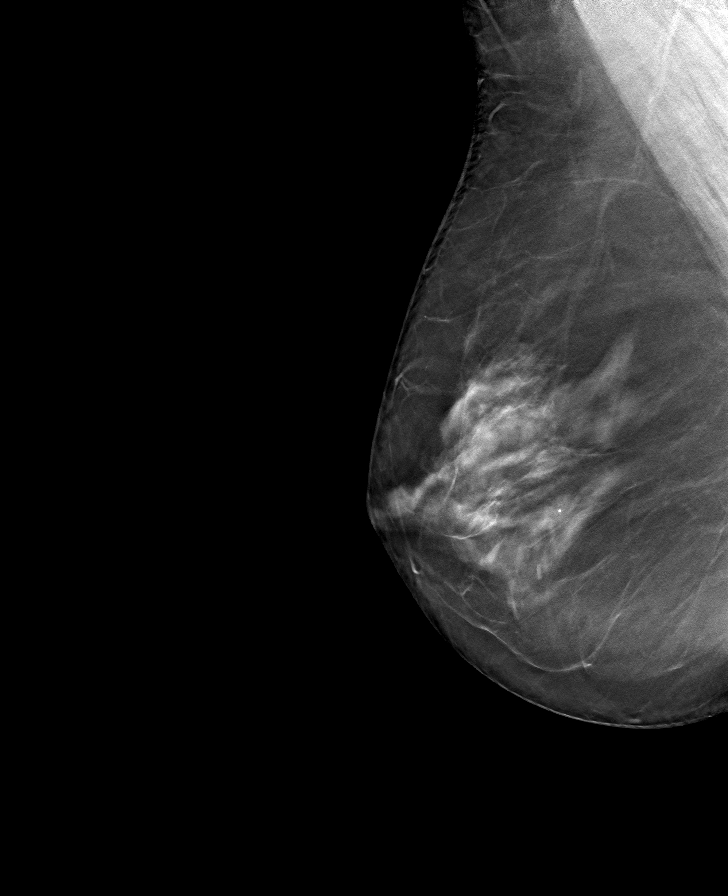

[R CC tomo · tomo slice 47/94.0]
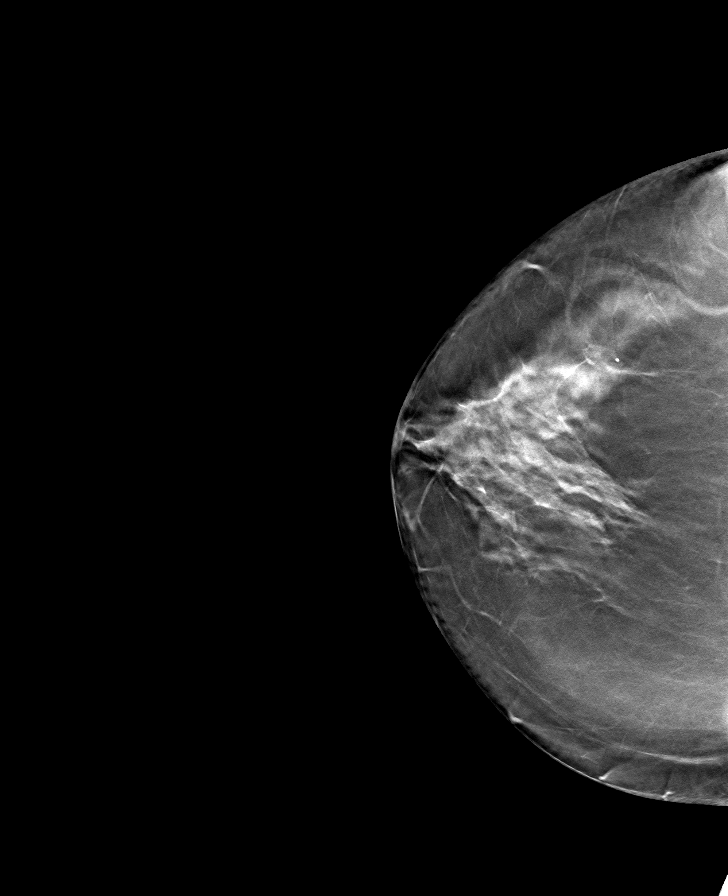

[8 of 24 positions shown; findings below may reference images not displayed]

ACR Breast Density Category c: The breast tissue is heterogeneously
dense, which may obscure small masses.
FINDINGS: There are no findings suspicious for malignancy. The images were
evaluated with computer-aided detection.
IMPRESSION: No mammographic evidence of malignancy. A result letter of this
screening mammogram will be mailed directly to the patient.

RECOMMENDATION:
Screening mammogram in one year. (Code:T4-5-GWO)

BI-RADS CATEGORY  1: Negative.

## 2023-08-18 ENCOUNTER — Ambulatory Visit (INDEPENDENT_AMBULATORY_CARE_PROVIDER_SITE_OTHER): Payer: Medicare Other | Admitting: Dermatology

## 2023-08-18 DIAGNOSIS — L578 Other skin changes due to chronic exposure to nonionizing radiation: Secondary | ICD-10-CM | POA: Diagnosis not present

## 2023-08-18 DIAGNOSIS — L814 Other melanin hyperpigmentation: Secondary | ICD-10-CM | POA: Diagnosis not present

## 2023-08-18 DIAGNOSIS — D239 Other benign neoplasm of skin, unspecified: Secondary | ICD-10-CM

## 2023-08-18 DIAGNOSIS — L820 Inflamed seborrheic keratosis: Secondary | ICD-10-CM | POA: Diagnosis not present

## 2023-08-18 DIAGNOSIS — Z1283 Encounter for screening for malignant neoplasm of skin: Secondary | ICD-10-CM | POA: Diagnosis not present

## 2023-08-18 DIAGNOSIS — D1801 Hemangioma of skin and subcutaneous tissue: Secondary | ICD-10-CM

## 2023-08-18 DIAGNOSIS — Z5111 Encounter for antineoplastic chemotherapy: Secondary | ICD-10-CM

## 2023-08-18 DIAGNOSIS — I8393 Asymptomatic varicose veins of bilateral lower extremities: Secondary | ICD-10-CM

## 2023-08-18 DIAGNOSIS — Z86007 Personal history of in-situ neoplasm of skin: Secondary | ICD-10-CM

## 2023-08-18 DIAGNOSIS — D229 Melanocytic nevi, unspecified: Secondary | ICD-10-CM

## 2023-08-18 DIAGNOSIS — D2362 Other benign neoplasm of skin of left upper limb, including shoulder: Secondary | ICD-10-CM

## 2023-08-18 DIAGNOSIS — Z85820 Personal history of malignant melanoma of skin: Secondary | ICD-10-CM

## 2023-08-18 DIAGNOSIS — L570 Actinic keratosis: Secondary | ICD-10-CM

## 2023-08-18 DIAGNOSIS — Z85828 Personal history of other malignant neoplasm of skin: Secondary | ICD-10-CM

## 2023-08-18 DIAGNOSIS — W908XXA Exposure to other nonionizing radiation, initial encounter: Secondary | ICD-10-CM | POA: Diagnosis not present

## 2023-08-18 DIAGNOSIS — Z7189 Other specified counseling: Secondary | ICD-10-CM

## 2023-08-18 DIAGNOSIS — Z86018 Personal history of other benign neoplasm: Secondary | ICD-10-CM

## 2023-08-18 DIAGNOSIS — L821 Other seborrheic keratosis: Secondary | ICD-10-CM

## 2023-08-18 DIAGNOSIS — I781 Nevus, non-neoplastic: Secondary | ICD-10-CM

## 2023-08-18 NOTE — Progress Notes (Signed)
Follow-Up Visit   Subjective  Brenda Wagner is a 67 y.o. female who presents for the following: Skin Cancer Screening and Full Body Skin Exam  The patient presents for Total-Body Skin Exam (TBSE) for skin cancer screening and mole check. The patient has spots, moles and lesions to be evaluated, some may be new or changing. Patient has a history of melanoma, BCC, SCC, Dysplastic Nevus. After last years visit, patient treated nose with 5FU/Calcipotriene cream with a good reaction.     The following portions of the chart were reviewed this encounter and updated as appropriate: medications, allergies, medical history  Review of Systems:  No other skin or systemic complaints except as noted in HPI or Assessment and Plan.  Objective  Well appearing patient in no apparent distress; mood and affect are within normal limits.  A full examination was performed including scalp, head, eyes, ears, nose, lips, neck, chest, axillae, abdomen, back, buttocks, bilateral upper extremities, bilateral lower extremities, hands, feet, fingers, toes, fingernails, and toenails. All findings within normal limits unless otherwise noted below.   Relevant physical exam findings are noted in the Assessment and Plan.  R cheek x 2, R temple x 1, L lower cheek x 1, R forehead x 1, R post lower neck x 1, L clavicle x 1 (7) Pink scaly macules.   R lat knee x 1, R frontal hairline x 1, R temporal hairline x 1 (3) Erythematous stuck-on, waxy papule    Assessment & Plan   SKIN CANCER SCREENING PERFORMED TODAY.  ACTINIC DAMAGE WITH PRECANCEROUS ACTINIC KERATOSES Counseling for Topical Chemotherapy Management: Patient exhibits: - Severe, confluent actinic changes with pre-cancerous actinic keratoses that is secondary to cumulative UV radiation exposure over time - Condition that is severe; chronic, not at goal. - diffuse scaly erythematous macules and papules with underlying dyspigmentation - Discussed Prescription  "Field Treatment" topical Chemotherapy for Severe, Chronic Confluent Actinic Changes with Pre-Cancerous Actinic Keratoses Field treatment involves treatment of an entire area of skin that has confluent Actinic Changes (Sun/ Ultraviolet light damage) and PreCancerous Actinic Keratoses by method of PhotoDynamic Therapy (PDT) and/or prescription Topical Chemotherapy agents such as 5-fluorouracil, 5-fluorouracil/calcipotriene, and/or imiquimod.  The purpose is to decrease the number of clinically evident and subclinical PreCancerous lesions to prevent progression to development of skin cancer by chemically destroying early precancer changes that may or may not be visible.  It has been shown to reduce the risk of developing skin cancer in the treated area. As a result of treatment, redness, scaling, crusting, and open sores may occur during treatment course. One or more than one of these methods may be used and may have to be used several times to control, suppress and eliminate the PreCancerous changes. Discussed treatment course, expected reaction, and possible side effects. - Recommend daily broad spectrum sunscreen SPF 30+ to sun-exposed areas, reapply every 2 hours as needed.  - Staying in the shade or wearing long sleeves, sun glasses (UVA+UVB protection) and wide brim hats (4-inch brim around the entire circumference of the hat) are also recommended. - Call for new or changing lesions. - Discussed Red light with debridement to face. Pt may schedule. - Start 5-fluorouracil/calcipotriene cream twice a day for 4-7 days to affected areas including forehead and cheeks. May treat entire face. Patient provided with handout reviewing treatment course and side effects and advised to call or message [place] on MyChart with any concerns.  Reviewed course of treatment and expected reaction.  Patient advised to expect  inflammation and crusting and advised that erosions are possible.  Patient advised to be diligent with sun  protection during and after treatment. Counseled to keep medication out of reach of children and pets.  LENTIGINES,  HEMANGIOMAS - Benign normal skin lesions - Benign-appearing - Call for any changes  MELANOCYTIC NEVI - Tan-brown and/or pink-flesh-colored symmetric macules and papules - Benign appearing on exam today - Observation - Call clinic for new or changing moles - Recommend daily use of broad spectrum spf 30+ sunscreen to sun-exposed areas.   Seborrheic Keratoses Left upper elbow waxy tan macule  10 x 7 cm - Stuck-on, waxy, tan-brown papules and/or plaques  - Benign-appearing - Discussed benign etiology and prognosis. - Observe - Call for any changes  Dermatofibroma vs Nevus - Left upper arm 6.0 mm firm flesh tan papule - Benign appearing - Call for any changes  History of Melanoma Right upper back 2016 Breslow's .5 mm, Clarks level IV, WLE 2016  - No evidence of recurrence today - Recommend regular full body skin exams - Recommend daily broad spectrum sunscreen SPF 30+ to sun-exposed areas, reapply every 2 hours as needed.  - Call if any new or changing lesions are noted between office visits   History of Squamous Cell Carcinoma in Situ of the Skin Left lower nasal dorsum 2022 treated with 27fu/vit d cream  - No evidence of recurrence today - Recommend regular full body skin exams - Recommend daily broad spectrum sunscreen SPF 30+ to sun-exposed areas, reapply every 2 hours as needed.  - Call if any new or changing lesions are noted between office visits   History of Basal Cell Carcinoma of the Skin Right upper chest (2016), R nasal tip (2017) - No evidence of recurrence today - Recommend regular full body skin exams - Recommend daily broad spectrum sunscreen SPF 30+ to sun-exposed areas, reapply every 2 hours as needed.  - Call if any new or changing lesions are noted between office visits   History of Dysplastic Nevus Right upper abdomen, mod to severe, 2017   - No evidence of recurrence today - Recommend regular full body skin exams - Recommend daily broad spectrum sunscreen SPF 30+ to sun-exposed areas, reapply every 2 hours as needed.  - Call if any new or changing lesions are noted between office visits   Varicose Veins/Spider Veins - Dilated blue, purple or red veins at the lower extremities - Reassured - Smaller vessels can be treated by sclerotherapy (a procedure to inject a medicine into the veins to make them disappear) if desired, but the treatment is not covered by insurance. Larger vessels may be covered if symptomatic and we would refer to vascular surgeon if treatment desired.   AK (actinic keratosis) (7) R cheek x 2, R temple x 1, L lower cheek x 1, R forehead x 1, R post lower neck x 1, L clavicle x 1  Actinic keratoses are precancerous spots that appear secondary to cumulative UV radiation exposure/sun exposure over time. They are chronic with expected duration over 1 year. A portion of actinic keratoses will progress to squamous cell carcinoma of the skin. It is not possible to reliably predict which spots will progress to skin cancer and so treatment is recommended to prevent development of skin cancer.  Recommend daily broad spectrum sunscreen SPF 30+ to sun-exposed areas, reapply every 2 hours as needed.  Recommend staying in the shade or wearing long sleeves, sun glasses (UVA+UVB protection) and wide brim hats (4-inch brim  around the entire circumference of the hat). Call for new or changing lesions.  Destruction of lesion - R cheek x 2, R temple x 1, L lower cheek x 1, R forehead x 1, R post lower neck x 1, L clavicle x 1 (7)  Destruction method: cryotherapy   Informed consent: discussed and consent obtained   Lesion destroyed using liquid nitrogen: Yes   Region frozen until ice ball extended beyond lesion: Yes   Outcome: patient tolerated procedure well with no complications   Post-procedure details: wound care  instructions given   Additional details:  Prior to procedure, discussed risks of blister formation, small wound, skin dyspigmentation, or rare scar following cryotherapy. Recommend Vaseline ointment to treated areas while healing.   Inflamed seborrheic keratosis (3) R lat knee x 1, R frontal hairline x 1, R temporal hairline x 1  Symptomatic, irritating, patient would like treated.  Destruction of lesion - R lat knee x 1, R frontal hairline x 1, R temporal hairline x 1 (3)  Destruction method: cryotherapy   Informed consent: discussed and consent obtained   Lesion destroyed using liquid nitrogen: Yes   Region frozen until ice ball extended beyond lesion: Yes   Outcome: patient tolerated procedure well with no complications   Post-procedure details: wound care instructions given   Additional details:  Prior to procedure, discussed risks of blister formation, small wound, skin dyspigmentation, or rare scar following cryotherapy. Recommend Vaseline ointment to treated areas while healing.    Return in about 1 year (around 08/17/2024) for TBSE, Hx melanoma, Hx BCC, Hx SCC, Hx Dysplastic Nevus.  ICherlyn Labella, CMA, am acting as scribe for Willeen Niece, MD .   Documentation: I have reviewed the above documentation for accuracy and completeness, and I agree with the above.  Willeen Niece, MD

## 2023-08-18 NOTE — Patient Instructions (Addendum)
Cryotherapy Aftercare  Wash gently with soap and water everyday.   Apply Vaseline and Band-Aid daily until healed.    - Start 5-fluorouracil/calcipotriene cream twice a day for 4-7 days to affected areas including forehead and cheeks. Patient provided with handout reviewing treatment course and side effects and advised to call or message [place] on MyChart with any concerns.  5-Fluorouracil/Calcipotriene Patient Education   Actinic keratoses are the dry, red scaly spots on the skin caused by sun damage. A portion of these spots can turn into skin cancer with time, and treating them can help prevent development of skin cancer.   Treatment of these spots requires removal of the defective skin cells. There are various ways to remove actinic keratoses, including freezing with liquid nitrogen, treatment with creams, or treatment with a blue light procedure in the office.   5-fluorouracil cream is a topical cream used to treat actinic keratoses. It works by interfering with the growth of abnormal fast-growing skin cells, such as actinic keratoses. These cells peel off and are replaced by healthy ones.   5-fluorouracil/calcipotriene is a combination of the 5-fluorouracil cream with a vitamin D analog cream called calcipotriene. The calcipotriene alone does not treat actinic keratoses. However, when it is combined with 5-fluorouracil, it helps the 5-fluorouracil treat the actinic keratoses much faster so that the same results can be achieved with a much shorter treatment time.  INSTRUCTIONS FOR 5-FLUOROURACIL/CALCIPOTRIENE CREAM:   5-fluorouracil/calcipotriene cream typically only needs to be used for 4-7 days. A thin layer should be applied twice a day to the treatment areas recommended by your physician.   If your physician prescribed you separate tubes of 5-fluourouracil and calcipotriene, apply a thin layer of 5-fluorouracil followed by a thin layer of calcipotriene.   Avoid contact with your eyes,  nostrils, and mouth. Do not use 5-fluorouracil/calcipotriene cream on infected or open wounds.   You will develop redness, irritation and some crusting at areas where you have pre-cancer damage/actinic keratoses. IF YOU DEVELOP PAIN, BLEEDING, OR SIGNIFICANT CRUSTING, STOP THE TREATMENT EARLY - you have already gotten a good response and the actinic keratoses should clear up well.  Wash your hands after applying 5-fluorouracil 5% cream on your skin.   A moisturizer or sunscreen with a minimum SPF 30 should be applied each morning.   Once you have finished the treatment, you can apply a thin layer of Vaseline twice a day to irritated areas to soothe and calm the areas more quickly. If you experience significant discomfort, contact your physician.  For some patients it is necessary to repeat the treatment for best results.  SIDE EFFECTS: When using 5-fluorouracil/calcipotriene cream, you may have mild irritation, such as redness, dryness, swelling, or a mild burning sensation. This usually resolves within 2 weeks. The more actinic keratoses you have, the more redness and inflammation you can expect during treatment. Eye irritation has been reported rarely. If this occurs, please let us know.  If you have any trouble using this cream, please call the office. If you have any other questions about this information, please do not hesitate to ask me before you leave the office.   Levulan/PDT Treatment Common Side Effects (Red Light treatment for precancers)  - Burning/stinging, which may be severe and last up to 24-72 hours after your treatment  - Redness, swelling and/or peeling which may last up to 4 weeks  - Scaling/crusting which may last up to 2 weeks  - Sun sensitivity (you MUST avoid sun exposure for 48-72  hours after treatment)  Care Instructions  - Okay to wash with soap and water and shampoo as normal  - If needed, you can do a cold compress (ex. Ice packs) for comfort  - If okay  with your Primary Doctor, you may use analgesics such as Tylenol every 4-6 hours, not to exceed recommended dose  - You may apply Cerave Healing Ointment, Vaseline or Aquaphor  - If you have a lot of swelling you may take a Benadryl to help with this (this may cause drowsiness)  Sun Precautions  - Wear a wide brim hat for the next week if outside  - Wear a sunblock with zinc or titanium dioxide at least SPF 50 daily   We will recheck you in 10-12 weeks. If any problems, please call the office and ask to speak with a nurse. Due to recent changes in healthcare laws, you may see results of your pathology and/or laboratory studies on MyChart before the doctors have had a chance to review them. We understand that in some cases there may be results that are confusing or concerning to you. Please understand that not all results are received at the same time and often the doctors may need to interpret multiple results in order to provide you with the best plan of care or course of treatment. Therefore, we ask that you please give [place] 2 business days to thoroughly review all your results before contacting the office for clarification. Should we see a critical lab result, you will be contacted sooner.   If You Need Anything After Your Visit  If you have any questions or concerns for your doctor, please call our main line at [phone] and press option 4 to reach your doctor's medical assistant. If no one answers, please leave a voicemail as directed and we will return your call as soon as possible. Messages left after 4 pm will be answered the following business day.   You may also send [place] a message via MyChart. We typically respond to MyChart messages within 1-2 business days.  For prescription refills, please ask your pharmacy to contact our office. Our fax number is [phone].  If you have an urgent issue when the clinic is closed that cannot wait until the next business day, you can page your  doctor at the number below.    Please note that while we do our best to be available for urgent issues outside of office hours, we are not available 24/7.   If you have an urgent issue and are unable to reach [place], you may choose to seek medical care at your doctor's office, retail clinic, urgent care center, or emergency room.  If you have a medical emergency, please immediately call 911 or go to the emergency department.  Pager Numbers  - Dr. Gwen Pounds: [phone]  - Dr. Roseanne Reno: [phone]  - Dr. Katrinka Blazing: [phone]   In the event of inclement weather, please call our main line at [phone] for an update on the status of any delays or closures.  Dermatology Medication Tips: Please keep the boxes that topical medications come in in order to help keep track of the instructions about where and how to use these. Pharmacies typically print the medication instructions only on the boxes and not directly on the medication tubes.   If your medication is too expensive, please contact our office at [phone] option 4 or send [place] a message through MyChart.   We are unable to tell what your co-pay for  medications will be in advance as this is different depending on your insurance coverage. However, we may be able to find a substitute medication at lower cost or fill out paperwork to get insurance to cover a needed medication.   If a prior authorization is required to get your medication covered by your insurance company, please allow [place] 1-2 business days to complete this process.  Drug prices often vary depending on where the prescription is filled and some pharmacies may offer cheaper prices.  The website www.goodrx.com contains coupons for medications through different pharmacies. The prices here do not account for what the cost may be with help from insurance (it may be cheaper with your insurance), but the website can give you the price if you did not use any insurance.  - You can print  the associated coupon and take it with your prescription to the pharmacy.  - You may also stop by our office during regular business hours and pick up a GoodRx coupon card.  - If you need your prescription sent electronically to a different pharmacy, notify our office through [facility] or by phone at [phone] option 4.     Si Usted Necesita Algo Despus de Su Visita  Tambin puede enviarnos un mensaje a travs de Clinical cytogeneticist. Por lo general respondemos a los mensajes de MyChart en el transcurso de 1 a 2 das hbiles.  Para renovar recetas, por favor pida a su farmacia que se ponga en contacto con nuestra oficina. Annie Sable de fax es [place] [phone].  Si tiene un asunto urgente cuando la clnica est cerrada y que no puede esperar hasta el siguiente da hbil, puede llamar/localizar a su doctor(a) al nmero que aparece a continuacin.   Por favor, tenga en cuenta que aunque hacemos todo lo posible para estar disponibles para asuntos urgentes fuera del horario de [place], no estamos disponibles las 24 horas del da, los 7 [address]  [address] de la [place].   Si tiene un problema urgente y no puede comunicarse con nosotros, puede optar por buscar atencin mdica  en el consultorio de su doctor(a), en una clnica privada, en un centro de atencin urgente o en una sala de emergencias.  Si tiene Engineer, drilling, por favor llame inmediatamente al 911 o vaya a la sala de emergencias.  Nmeros de bper  - Dr. Gwen Pounds: [phone]  - Dra. Roseanne Reno: [phone]  - Dr. Katrinka Blazing: [phone]   En caso de inclemencias del tiempo, por favor llame a Lacy Duverney principal al [phone] para una actualizacin sobre el [place] de cualquier retraso o cierre.  Consejos para la medicacin en dermatologa: Por favor, guarde las cajas en las que vienen los medicamentos de uso tpico para ayudarle a seguir las instrucciones sobre dnde y cmo usarlos. Las farmacias generalmente imprimen las  instrucciones del medicamento slo en las cajas y no directamente en los tubos del [place].   Si su medicamento es muy caro, por favor, pngase en contacto con Rolm [place]ala llamando al [phone] y presione la opcin 4 o envenos un mensaje a travs de Clinical cytogeneticist.   No podemos decirle cul ser su copago por los medicamentos por adelantado ya que esto es diferente dependiendo de la cobertura de su seguro. Sin embargo, es posible que podamos encontrar un medicamento sustituto a Audiological scientist un formulario para que el seguro cubra el medicamento que se considera necesario.   Si se requiere una autorizacin previa para que su compaa de seguros [place] su medicamento, por favor  permtanos de 1 a 2 das hbiles para completar [address]  Los precios de los medicamentos varan con frecuencia dependiendo del Environmental consultant de dnde se surte la receta y alguna farmacias pueden ofrecer precios ms baratos.  El sitio web www.goodrx.com tiene cupones para medicamentos de Health and safety inspector. Los precios aqu no tienen en cuenta lo que podra costar con la ayuda del seguro (puede ser ms barato con su seguro), pero el sitio web puede darle el precio si no utiliz Tourist information centre manager.  - Puede imprimir el cupn correspondiente y llevarlo con su receta a la farmacia.  - Tambin puede pasar por nuestra oficina durante el horario de atencin regular y Education officer, museum una tarjeta de cupones de GoodRx.  - Si necesita que su receta se enve electrnicamente a una farmacia diferente, informe a nuestra oficina a travs de MyChart de [place] o por telfono llamando al [phone] y presione la opcin 4.

## 2024-08-01 ENCOUNTER — Ambulatory Visit: Payer: Medicare Other | Admitting: Dermatology

## 2024-08-10 ENCOUNTER — Ambulatory Visit: Admitting: Dermatology

## 2024-11-09 ENCOUNTER — Encounter: Admitting: Dermatology

## 2024-11-30 ENCOUNTER — Encounter: Admitting: Dermatology
